# Patient Record
Sex: Female | Born: 1940
Health system: Southern US, Community
[De-identification: ages and names within clinical notes are randomized; demographics above are authoritative.]

## PROBLEM LIST (undated history)

## (undated) DIAGNOSIS — K5900 Constipation, unspecified: Secondary | ICD-10-CM

## (undated) DIAGNOSIS — E669 Obesity, unspecified: Secondary | ICD-10-CM

## (undated) DIAGNOSIS — M25519 Pain in unspecified shoulder: Secondary | ICD-10-CM

## (undated) DIAGNOSIS — I5032 Chronic diastolic (congestive) heart failure: Secondary | ICD-10-CM

## (undated) DIAGNOSIS — R0602 Shortness of breath: Secondary | ICD-10-CM

## (undated) DIAGNOSIS — R609 Edema, unspecified: Secondary | ICD-10-CM

## (undated) DIAGNOSIS — I5189 Other ill-defined heart diseases: Secondary | ICD-10-CM

## (undated) DIAGNOSIS — I10 Essential (primary) hypertension: Secondary | ICD-10-CM

## (undated) DIAGNOSIS — M25569 Pain in unspecified knee: Secondary | ICD-10-CM

## (undated) DIAGNOSIS — K219 Gastro-esophageal reflux disease without esophagitis: Secondary | ICD-10-CM

## (undated) DIAGNOSIS — E559 Vitamin D deficiency, unspecified: Secondary | ICD-10-CM

## (undated) DIAGNOSIS — E785 Hyperlipidemia, unspecified: Secondary | ICD-10-CM

## (undated) DIAGNOSIS — R001 Bradycardia, unspecified: Secondary | ICD-10-CM

## (undated) HISTORY — DX: Obesity, unspecified: E66.9

## (undated) HISTORY — DX: Shortness of breath: R06.02

## (undated) HISTORY — DX: Essential (primary) hypertension: I10

## (undated) HISTORY — DX: Pain in unspecified knee: M25.569

## (undated) HISTORY — DX: Pain in unspecified shoulder: M25.519

## (undated) HISTORY — DX: Edema, unspecified: R60.9

## (undated) HISTORY — DX: Bradycardia, unspecified: R00.1

## (undated) HISTORY — DX: Chronic diastolic (congestive) heart failure: I50.32

## (undated) HISTORY — DX: Other ill-defined heart diseases: I51.89

## (undated) HISTORY — PX: NECK SURGERY: SHX720

## (undated) HISTORY — PX: SHOULDER ARTHROSCOPY: SHX128

## (undated) HISTORY — DX: Vitamin D deficiency, unspecified: E55.9

## (undated) HISTORY — DX: Gastro-esophageal reflux disease without esophagitis: K21.9

## (undated) HISTORY — PX: ABDOMINAL HYSTERECTOMY: SHX81

## (undated) HISTORY — DX: Constipation, unspecified: K59.00

---

## 1970-09-09 HISTORY — PX: OOPHORECTOMY: SHX86

## 1975-09-10 HISTORY — PX: APPENDECTOMY: SHX54

## 1984-09-09 HISTORY — PX: KNEE SURGERY: SHX244

## 1998-04-11 ENCOUNTER — Other Ambulatory Visit: Admission: RE | Admit: 1998-04-11 | Discharge: 1998-04-11 | Payer: Self-pay | Admitting: Obstetrics and Gynecology

## 1999-04-19 ENCOUNTER — Other Ambulatory Visit: Admission: RE | Admit: 1999-04-19 | Discharge: 1999-04-19 | Payer: Self-pay | Admitting: Obstetrics and Gynecology

## 2000-06-09 ENCOUNTER — Other Ambulatory Visit: Admission: RE | Admit: 2000-06-09 | Discharge: 2000-06-09 | Payer: Self-pay | Admitting: Obstetrics and Gynecology

## 2001-08-11 ENCOUNTER — Other Ambulatory Visit: Admission: RE | Admit: 2001-08-11 | Discharge: 2001-08-11 | Payer: Self-pay | Admitting: Obstetrics and Gynecology

## 2002-08-30 ENCOUNTER — Other Ambulatory Visit: Admission: RE | Admit: 2002-08-30 | Discharge: 2002-08-30 | Payer: Self-pay | Admitting: Obstetrics and Gynecology

## 2003-09-27 ENCOUNTER — Encounter: Admission: RE | Admit: 2003-09-27 | Discharge: 2003-09-27 | Payer: Self-pay | Admitting: Gastroenterology

## 2004-10-02 ENCOUNTER — Other Ambulatory Visit: Admission: RE | Admit: 2004-10-02 | Discharge: 2004-10-02 | Payer: Self-pay | Admitting: Obstetrics and Gynecology

## 2008-08-23 ENCOUNTER — Encounter: Admission: RE | Admit: 2008-08-23 | Discharge: 2008-08-23 | Payer: Self-pay | Admitting: Family Medicine

## 2009-02-07 HISTORY — PX: CARDIAC CATHETERIZATION: SHX172

## 2009-02-15 ENCOUNTER — Encounter: Admission: RE | Admit: 2009-02-15 | Discharge: 2009-02-15 | Payer: Self-pay | Admitting: Cardiology

## 2009-02-20 ENCOUNTER — Inpatient Hospital Stay (HOSPITAL_BASED_OUTPATIENT_CLINIC_OR_DEPARTMENT_OTHER): Admission: RE | Admit: 2009-02-20 | Discharge: 2009-02-20 | Payer: Self-pay | Admitting: Cardiology

## 2010-06-12 ENCOUNTER — Encounter: Admission: RE | Admit: 2010-06-12 | Discharge: 2010-06-12 | Payer: Self-pay | Admitting: Neurology

## 2010-12-25 ENCOUNTER — Other Ambulatory Visit (HOSPITAL_BASED_OUTPATIENT_CLINIC_OR_DEPARTMENT_OTHER): Payer: Self-pay | Admitting: Family Medicine

## 2010-12-25 DIAGNOSIS — Z1231 Encounter for screening mammogram for malignant neoplasm of breast: Secondary | ICD-10-CM

## 2010-12-31 ENCOUNTER — Ambulatory Visit (HOSPITAL_BASED_OUTPATIENT_CLINIC_OR_DEPARTMENT_OTHER)
Admission: RE | Admit: 2010-12-31 | Discharge: 2010-12-31 | Disposition: A | Discharge status: Home / Self Care | Payer: Medicare Other | Source: Ambulatory Visit | Attending: Family Medicine | Admitting: Family Medicine

## 2010-12-31 DIAGNOSIS — Z1231 Encounter for screening mammogram for malignant neoplasm of breast: Secondary | ICD-10-CM | POA: Insufficient documentation

## 2011-01-22 NOTE — Cardiovascular Report (Signed)
[name], [name]            ACCOUNT NO.:  [id number]   MEDICAL RECORD NO.:  [id number]          PATIENT TYPE:  OIB   LOCATION:  1965                         FACILITY:  [facility]   PHYSICIAN:  Armanda Magic, M.D.     DATE OF BIRTH:  1941/03/22   DATE OF PROCEDURE:  02/20/2009  DATE OF DISCHARGE:  02/20/2009                            CARDIAC CATHETERIZATION   REFERRING PHYSICIAN:  Molly Maduro L. Foy [place], MD   PROCEDURES:  1. Left heart catheterization.  2. Coronary angiography.  3. Left ventriculography.   OPERATOR:  Armanda Magic, MD   INDICATION:  Chest pain.   COMPLICATIONS:  None.   IV ACCESS:  Via right femoral artery using 4-French sheath.   INTRAVENOUS MEDICATIONS:  Versed 1 mg, fentanyl 25 mcg.   This is a 70 year old female with a history of hypertension, a very  strong family history of coronary disease, who presented with chest pain  and now presents for cardiac catheterization.   The patient was brought to the cardiac catheterization laboratory in a  fasting, nonsedated state.  Informed consent was obtained.  The patient  was connected to continuous heart rate and pulse oximetry monitoring and  intermittent blood pressure monitor.  The right groin was prepped and  draped in a sterile fashion.  A 1% Xylocaine was used for local  anesthesia.  Using the modified Seldinger technique, a 4-French sheath  was placed in the right femoral artery.  Under fluoroscopic guidance, a  4-French JL4 catheter was placed in the left coronary artery.  Multiple  cine films were taken in 30-degree RAO and 40-degree LAO views.  This  catheter was then exchanged out over a guidewire for a 4-French 3DRCA  catheter, which successfully engaged the coronary ostium.  Multiple cine  films were taken at 30-degree RAO and 40-degree LAO views of the right  coronary artery.   The right catheter was then exchanged over a guidewire for a 4-French  angled pigtail catheter, which was placed under  fluoroscopic guidance in  left ventricular cavity.  Left ventriculography was performed in the 30-  degree RAO view using total of 25 mL at 12 mL per second.  The catheter  was then pulled back across the aortic valve with no significant  gradient noted.  At the end of the procedure, all catheters and sheaths  were removed.  Manual compression was performed, and adequate hemostasis  was obtained.  The patient was transferred back to room in stable  condition.   RESULTS:  1. The left main coronary artery is widely patent and bifurcates into      a left anterior descending artery and left circumflex artery.  2. Left anterior descending artery is widely patent throughout its      course in the apex.  It gives rise to a very small first diagonal      branch, which is patent.  It then gives rise to a second moderate-      sized diagonal 2 branch, which is widely patent and bifurcates into      2 daughter branches, both of which are widely patent.  The  vessel      then gives rise to third and fourth diagonal branches, both of      which are widely patent but very small.  The ongoing LAD traverses      the apex and is widely patent.  3. Left circumflex is widely patent throughout its course and      proximally gives rise to a very small obtuse marginal 1 branch,      which is patent.  The ongoing circumflex traverses the AV groove      and distally, gives rise to a large second obtuse marginal branch,      which bifurcates into 2 daughter vessels.  The superior branch then      bifurcates again into 2 daughter vessels, both of which are widely      patent.  The inferior branch is widely patent as well.  The ongoing      circumflex is widely patent.  4. The right coronary artery is widely patent throughout its course      and distally bifurcates in a posterior descending artery and      posterolateral artery.  The PDA and PL are both widely patent and      traversing inferior wall.   Left  ventriculography shows normal LV function, EF 65%, LV pressure  139/13 mmHg, aortic pressure 136/57 mmHg, LVEDP 22 mmHg.   ASSESSMENT:  1. Normal coronary arteries.  2. Noncardiac chest pain.  3. Normal left ventricular function.  4. Elevated left ventricular end-diastolic pressure, consistent with      diastolic dysfunction.  5. Hypertension.   PLAN:  Discharge to home after IV fluid and bedrest.  Continue beta-  blocker for diastolic dysfunction.  We will add Lasix 20 mg a day for  elevated LVEDP.  Follow up in 2 weeks with me for groin check, and we  will check her BMET at that time.      Armanda Magic, M.D.  Electronically Signed     TT/MEDQ  D:  02/20/2009  T:  02/21/2009  Job:  [id number]   cc:   [name], M.D.

## 2011-04-22 ENCOUNTER — Emergency Department (INDEPENDENT_AMBULATORY_CARE_PROVIDER_SITE_OTHER): Payer: Medicare Other

## 2011-04-22 ENCOUNTER — Emergency Department (HOSPITAL_BASED_OUTPATIENT_CLINIC_OR_DEPARTMENT_OTHER)
Admission: EM | Admit: 2011-04-22 | Discharge: 2011-04-22 | Disposition: A | Discharge status: Home / Self Care | Payer: Medicare Other | Attending: Emergency Medicine | Admitting: Emergency Medicine

## 2011-04-22 DIAGNOSIS — S50311A Abrasion of right elbow, initial encounter: Secondary | ICD-10-CM

## 2011-04-22 DIAGNOSIS — Y93H2 Activity, gardening and landscaping: Secondary | ICD-10-CM | POA: Insufficient documentation

## 2011-04-22 DIAGNOSIS — E785 Hyperlipidemia, unspecified: Secondary | ICD-10-CM | POA: Insufficient documentation

## 2011-04-22 DIAGNOSIS — R079 Chest pain, unspecified: Secondary | ICD-10-CM

## 2011-04-22 DIAGNOSIS — K219 Gastro-esophageal reflux disease without esophagitis: Secondary | ICD-10-CM | POA: Insufficient documentation

## 2011-04-22 DIAGNOSIS — I10 Essential (primary) hypertension: Secondary | ICD-10-CM | POA: Insufficient documentation

## 2011-04-22 DIAGNOSIS — M25529 Pain in unspecified elbow: Secondary | ICD-10-CM

## 2011-04-22 DIAGNOSIS — S20219A Contusion of unspecified front wall of thorax, initial encounter: Secondary | ICD-10-CM | POA: Insufficient documentation

## 2011-04-22 DIAGNOSIS — Y92009 Unspecified place in unspecified non-institutional (private) residence as the place of occurrence of the external cause: Secondary | ICD-10-CM | POA: Insufficient documentation

## 2011-04-22 DIAGNOSIS — M79609 Pain in unspecified limb: Secondary | ICD-10-CM

## 2011-04-22 DIAGNOSIS — I517 Cardiomegaly: Secondary | ICD-10-CM | POA: Insufficient documentation

## 2011-04-22 DIAGNOSIS — W11XXXA Fall on and from ladder, initial encounter: Secondary | ICD-10-CM

## 2011-04-22 DIAGNOSIS — T148XXA Other injury of unspecified body region, initial encounter: Secondary | ICD-10-CM

## 2011-04-22 DIAGNOSIS — K449 Diaphragmatic hernia without obstruction or gangrene: Secondary | ICD-10-CM | POA: Insufficient documentation

## 2011-04-22 DIAGNOSIS — IMO0002 Reserved for concepts with insufficient information to code with codable children: Secondary | ICD-10-CM | POA: Insufficient documentation

## 2011-04-22 HISTORY — DX: Gastro-esophageal reflux disease without esophagitis: K21.9

## 2011-04-22 HISTORY — DX: Hyperlipidemia, unspecified: E78.5

## 2011-04-22 HISTORY — DX: Essential (primary) hypertension: I10

## 2011-04-22 LAB — URINALYSIS, ROUTINE W REFLEX MICROSCOPIC
Bilirubin Urine: NEGATIVE
Glucose, UA: NEGATIVE mg/dL
Ketones, ur: NEGATIVE mg/dL
Leukocytes, UA: NEGATIVE
Nitrite: NEGATIVE
Protein, ur: NEGATIVE mg/dL
Specific Gravity, Urine: 1.006 (ref 1.005–1.030)
Urobilinogen, UA: 0.2 mg/dL (ref 0.0–1.0)
pH: 7 (ref 5.0–8.0)

## 2011-04-22 LAB — DIFFERENTIAL
Basophils Relative: 0 % (ref 0–1)
Eosinophils Absolute: 0.1 10*3/uL (ref 0.0–0.7)
Lymphocytes Relative: 23 % (ref 12–46)
Lymphs Abs: 2.3 10*3/uL (ref 0.7–4.0)
Monocytes Absolute: 0.5 10*3/uL (ref 0.1–1.0)
Monocytes Relative: 5 % (ref 3–12)
Neutrophils Relative %: 70 % (ref 43–77)

## 2011-04-22 LAB — CBC
HCT: 37.6 % (ref 36.0–46.0)
Hemoglobin: 13 g/dL (ref 12.0–15.0)
MCH: 31.9 pg (ref 26.0–34.0)
Platelets: 206 10*3/uL (ref 150–400)
RBC: 4.08 MIL/uL (ref 3.87–5.11)
RDW: 12.5 % (ref 11.5–15.5)
WBC: 10 10*3/uL (ref 4.0–10.5)

## 2011-04-22 LAB — COMPREHENSIVE METABOLIC PANEL
ALT: 14 U/L (ref 0–35)
AST: 20 U/L (ref 0–37)
Albumin: 4.1 g/dL (ref 3.5–5.2)
Alkaline Phosphatase: 69 U/L (ref 39–117)
BUN: 21 mg/dL (ref 6–23)
CO2: 24 mEq/L (ref 19–32)
Chloride: 104 mEq/L (ref 96–112)
Creatinine, Ser: 0.5 mg/dL (ref 0.50–1.10)
GFR calc Af Amer: >60 mL/min (ref >60)
Glucose, Bld: 92 mg/dL (ref 70–99)
Potassium: 3.8 mEq/L (ref 3.5–5.1)
Sodium: 141 mEq/L (ref 135–145)
Total Bilirubin: 0.3 mg/dL (ref 0.3–1.2)
Total Protein: 7.2 g/dL (ref 6.0–8.3)

## 2011-04-22 MED ORDER — HYDROMORPHONE HCL 1 MG/ML IJ SOLN: 1.0000 mg | Freq: Once | INTRAMUSCULAR | Status: DC

## 2011-04-22 MED ORDER — OXYCODONE-ACETAMINOPHEN 5-325 MG PO TABS: 1.0000 | ORAL_TABLET | ORAL | Status: AC | PRN

## 2011-04-22 MED ORDER — ONDANSETRON 8 MG PO TBDP
8.0000 mg | ORAL_TABLET | Freq: Once | ORAL | Status: AC
  Administered 2011-04-22: 8 mg via ORAL
  Filled 2011-04-22: qty 1

## 2011-04-22 MED ORDER — ONDANSETRON HCL 4 MG/2ML IJ SOLN: 4.0000 mg | Freq: Once | INTRAMUSCULAR | Status: DC

## 2011-04-22 MED ORDER — IOHEXOL 300 MG/ML  SOLN
100.0000 mL | Freq: Once | INTRAMUSCULAR | Status: AC | PRN
  Administered 2011-04-22: 100 mL via INTRAVENOUS

## 2011-04-22 MED ORDER — HYDROMORPHONE HCL 2 MG/ML IJ SOLN
2.0000 mg | Freq: Once | INTRAMUSCULAR | Status: AC
  Administered 2011-04-22: 2 mg via INTRAMUSCULAR
  Filled 2011-04-22: qty 1

## 2011-04-22 MED ORDER — METOCLOPRAMIDE HCL 5 MG/ML IJ SOLN
INTRAMUSCULAR | Status: AC
  Administered 2011-04-22: 10 mg via INTRAVENOUS
  Filled 2011-04-22: qty 2

## 2011-04-22 MED ORDER — METOCLOPRAMIDE HCL 10 MG PO TABS: 10.0000 mg | ORAL_TABLET | Freq: Four times a day (QID) | ORAL | Status: AC

## 2011-04-22 MED ORDER — SODIUM CHLORIDE 0.9 % IV SOLN: Freq: Once | INTRAVENOUS | Status: DC

## 2011-04-22 MED ORDER — METOCLOPRAMIDE HCL 5 MG/ML IJ SOLN
10.0000 mg | Freq: Once | INTRAMUSCULAR | Status: AC
  Administered 2011-04-22: 10 mg via INTRAVENOUS

## 2011-04-22 NOTE — ED Provider Notes (Signed)
History     CSN: [id number] Arrival date & time: 04/22/2011 12:12 PM  Chief Complaint  Patient presents with  . Fall    pain right ribs; right elbow and right upper and lower leg   Patient is a 70 y.o. female presenting with fall. The history is provided by the patient.  Fall The accident occurred 1 to 2 hours ago. The fall occurred from a ladder. She fell from a height of 3 to 5 ft. Impact surface: She was trimming a bush and fell off the ladder, her right elbow hitting a brick wall, then landing on her right side. There was no blood loss.  She also suffered some minor bruises to her right leg. Pain is severe, 10/10. She denies head, neck or back injury.  Past Medical History  Diagnosis Date  . Hyperlipemia   . Hypertension   . Gastric reflux     Past Surgical History  Procedure Date  . Shoulder arthroscopy   . Neck surgery   . Oophorectomy   . Abdominal hysterectomy     No family history on file.  History  Substance Use Topics  . Smoking status: Never Smoker   . Smokeless tobacco: Not on file  . Alcohol Use: No    OB History    Grav Para Term Preterm Abortions TAB SAB Ect Mult Living                  Review of Systems  All other systems reviewed and are negative.    Physical Exam  BP 134/63  Pulse 58  Temp(Src) 98.6 F (37 C) (Oral)  Resp 20  SpO2 97%  Physical Exam  Nursing note and vitals reviewed. Constitutional: She is oriented to person, place, and time. She appears well-developed and well-nourished.       Appears to be in pain.  HENT:  Head: Normocephalic and atraumatic.  Right Ear: External ear normal.  Left Ear: External ear normal.  Nose: Nose normal.  Mouth/Throat: Oropharynx is clear and moist.  Eyes: Conjunctivae and EOM are normal. Pupils are equal, round, and reactive to light. No scleral icterus.  Neck: No JVD present.       No cervical tenderness.  Cardiovascular: Normal rate, regular rhythm, normal heart sounds and intact distal  pulses.   No murmur heard. Pulmonary/Chest: Effort normal and breath sounds normal. She has no wheezes. She has no rales.       Point tenderness over the lower right lateral rib cage.  Abdominal: Soft. Bowel sounds are normal. There is no tenderness.  Musculoskeletal: Normal range of motion.       Minor abrasion, moderate swelling of the right olecranon. Full passive ROM, neurovascular is intact.  Lymphadenopathy:    She has no cervical adenopathy.  Neurological: She is alert and oriented to person, place, and time. No cranial nerve deficit. Coordination normal.  Skin: Skin is warm and dry. No rash noted.  Psychiatric: She has a normal mood and affect.    ED Course  Procedures  MDM Blunt trauma, rule out fracture, internal injury.  Labs and x-rays reviewed, images viewed by me. No internal or bony injury. She got good relief of pain from Dilaudid and Zofran.  Results for orders placed during the hospital encounter of 04/22/11  CBC      Component Value Range   WBC 10.0  4.0 - 10.5 (K/uL)   RBC 4.08  3.87 - 5.11 (MIL/uL)   Hemoglobin 13.0  12.0 -  15.0 (g/dL)   HCT 45.4  09.8 - 11.9 (%)   MCV 92.2  78.0 - 100.0 (fL)   MCH 31.9  26.0 - 34.0 (pg)   MCHC 34.6  30.0 - 36.0 (g/dL)   RDW 14.7  82.9 - 56.2 (%)   Platelets 206  150 - 400 (K/uL)  DIFFERENTIAL      Component Value Range   Neutrophils Relative 70  43 - 77 (%)   Neutro Abs 7.0  1.7 - 7.7 (K/uL)   Lymphocytes Relative 23  12 - 46 (%)   Lymphs Abs 2.3  0.7 - 4.0 (K/uL)   Monocytes Relative 5  3 - 12 (%)   Monocytes Absolute 0.5  0.1 - 1.0 (K/uL)   Eosinophils Relative 1  0 - 5 (%)   Eosinophils Absolute 0.1  0.0 - 0.7 (K/uL)   Basophils Relative 0  0 - 1 (%)   Basophils Absolute 0.0  0.0 - 0.1 (K/uL)  COMPREHENSIVE METABOLIC PANEL      Component Value Range   Sodium 141  135 - 145 (mEq/L)   Potassium 3.8  3.5 - 5.1 (mEq/L)   Chloride 104  96 - 112 (mEq/L)   CO2 24  19 - 32 (mEq/L)   Glucose, Bld 92  70 - 99 (mg/dL)     BUN 21  6 - 23 (mg/dL)   Creatinine, Ser 1.30  0.50 - 1.10 (mg/dL)   Calcium 9.4  8.4 - 86.5 (mg/dL)   Total Protein 7.2  6.0 - 8.3 (g/dL)   Albumin 4.1  3.5 - 5.2 (g/dL)   AST 20  0 - 37 (U/L)   ALT 14  0 - 35 (U/L)   Alkaline Phosphatase 69  39 - 117 (U/L)   Total Bilirubin 0.3  0.3 - 1.2 (mg/dL)   GFR calc non Af Amer >60  >60 (mL/min)   GFR calc Af Amer >60  >60 (mL/min)  URINALYSIS, ROUTINE W REFLEX MICROSCOPIC      Component Value Range   Color, Urine YELLOW  YELLOW    Appearance CLEAR  CLEAR    Specific Gravity, Urine 1.006  1.005 - 1.030    pH 7.0  5.0 - 8.0    Glucose, UA NEGATIVE  NEGATIVE (mg/dL)   Hgb urine dipstick NEGATIVE  NEGATIVE    Bilirubin Urine NEGATIVE  NEGATIVE    Ketones, ur NEGATIVE  NEGATIVE (mg/dL)   Protein, ur NEGATIVE  NEGATIVE (mg/dL)   Urobilinogen, UA 0.2  0.0 - 1.0 (mg/dL)   Nitrite NEGATIVE  NEGATIVE    Leukocytes, UA NEGATIVE  NEGATIVE    Dg Elbow Complete Right  04/22/2011  *RADIOLOGY REPORT*  Clinical Data: Larey Seat from a ladder.  Landed on brick wall on right side.  Pain in the lateral ribs and right elbow.  Laceration.  RIGHT ELBOW - COMPLETE 3+ VIEW  Comparison: None.  Findings: There is no evidence for acute fracture or subluxation. No radiopaque foreign body or soft tissue gas identified. Degenerative changes are seen in the elbow.  IMPRESSION: Negative exam.  Original Report Authenticated By: Patterson Hammersmith, M.D.   Ct Chest W Contrast  04/22/2011  *RADIOLOGY REPORT*  Clinical Data:  Trauma, fall from ladder, hit brick wall  CT CHEST, ABDOMEN AND PELVIS WITH CONTRAST  Technique:  Multidetector CT imaging of the chest, abdomen and pelvis was performed following the standard protocol during bolus administration of intravenous contrast.  Contrast: 100 ml Omnipaque-300 IV  Comparison:  None.  CT  CHEST  Findings:  Visualized thyroid is unremarkable.  Mild patchy opacity in the bilateral lower lobes, likely atelectasis.  No suspicious pulmonary  nodules. No pleural effusion or pneumothorax.  Mild cardiomegaly.  No pericardial effusion.  Mild atherosclerotic calcifications of the aortic arch.  No suspicious mediastinal, hilar, or axillary lymphadenopathy.  Degenerative changes of the thoracic spine.  Prior anterior cervical spine fixation incompletely visualized.  No fracture is seen.  IMPRESSION: No evidence of traumatic injury to the chest.  CT ABDOMEN AND PELVIS  Findings:  Small hiatal hernia.  Numerous hypoenhancing lesions throughout the liver, some which are too small to characterize, but many of which reflect benign cysts or hemangiomas.  Spleen, pancreas, and adrenal glands are within normal limits.  Gallbladder is unremarkable.  No intrahepatic or extrahepatic ductal dilatation.  Kidneys are within normal limits.  No hydronephrosis.  No evidence of bowel obstruction.  No abdominopelvic ascites.  No hemoperitoneum.  No free air.  No suspicious abdominopelvic lymphadenopathy.  Atherosclerotic calcifications of the abdominal aorta and branch vessels.  Status post hysterectomy.  No adnexal masses.  Bladder is within normal limits.  Degenerative changes of the lumbar spine.  No fracture is seen.  IMPRESSION: No evidence of traumatic injury to the abdomen/pelvis.  Original Report Authenticated By: Charline Bills, M.D.   Ct Abdomen Pelvis W Contrast  04/22/2011  *RADIOLOGY REPORT*  Clinical Data:  Trauma, fall from ladder, hit brick wall  CT CHEST, ABDOMEN AND PELVIS WITH CONTRAST  Technique:  Multidetector CT imaging of the chest, abdomen and pelvis was performed following the standard protocol during bolus administration of intravenous contrast.  Contrast: 100 ml Omnipaque-300 IV  Comparison:  None.  CT CHEST  Findings:  Visualized thyroid is unremarkable.  Mild patchy opacity in the bilateral lower lobes, likely atelectasis.  No suspicious pulmonary nodules. No pleural effusion or pneumothorax.  Mild cardiomegaly.  No pericardial effusion.  Mild  atherosclerotic calcifications of the aortic arch.  No suspicious mediastinal, hilar, or axillary lymphadenopathy.  Degenerative changes of the thoracic spine.  Prior anterior cervical spine fixation incompletely visualized.  No fracture is seen.  IMPRESSION: No evidence of traumatic injury to the chest.  CT ABDOMEN AND PELVIS  Findings:  Small hiatal hernia.  Numerous hypoenhancing lesions throughout the liver, some which are too small to characterize, but many of which reflect benign cysts or hemangiomas.  Spleen, pancreas, and adrenal glands are within normal limits.  Gallbladder is unremarkable.  No intrahepatic or extrahepatic ductal dilatation.  Kidneys are within normal limits.  No hydronephrosis.  No evidence of bowel obstruction.  No abdominopelvic ascites.  No hemoperitoneum.  No free air.  No suspicious abdominopelvic lymphadenopathy.  Atherosclerotic calcifications of the abdominal aorta and branch vessels.  Status post hysterectomy.  No adnexal masses.  Bladder is within normal limits.  Degenerative changes of the lumbar spine.  No fracture is seen.  IMPRESSION: No evidence of traumatic injury to the abdomen/pelvis.  Original Report Authenticated By: Charline Bills, M.D.          Dione Booze, MD 04/22/11 [phone]

## 2011-04-22 NOTE — ED Notes (Signed)
MD at bedside. 

## 2011-04-22 NOTE — ED Notes (Signed)
Fell off of ladder approx 3 feet with an electric cutter in her hand and ladder flipped backwards and hit brick wall on right side; did not hit head; no LOC

## 2011-04-22 NOTE — ED Notes (Signed)
Pt returned from CT. Pt is CAO and in NAD. Pt sts the Reglan has helped her nausea and sts that her pain is "ok". Pt and family updated on POC and verbalized understanding.

## 2011-04-22 NOTE — ED Notes (Signed)
Pt lying in bed sleeping but rouses to voice. Pt is then CAO and in NAD. Pt and family awaiting re-eval by ED MD. No needs voiced at this time.

## 2012-01-14 ENCOUNTER — Other Ambulatory Visit (HOSPITAL_BASED_OUTPATIENT_CLINIC_OR_DEPARTMENT_OTHER): Payer: Self-pay | Admitting: Family

## 2012-01-14 DIAGNOSIS — Z1231 Encounter for screening mammogram for malignant neoplasm of breast: Secondary | ICD-10-CM

## 2012-01-15 ENCOUNTER — Ambulatory Visit (HOSPITAL_BASED_OUTPATIENT_CLINIC_OR_DEPARTMENT_OTHER)
Admission: RE | Admit: 2012-01-15 | Discharge: 2012-01-15 | Disposition: A | Discharge status: Home / Self Care | Payer: Medicare Other | Source: Ambulatory Visit | Attending: Family Medicine | Admitting: Family Medicine

## 2012-01-15 DIAGNOSIS — Z1231 Encounter for screening mammogram for malignant neoplasm of breast: Secondary | ICD-10-CM | POA: Insufficient documentation

## 2013-03-08 ENCOUNTER — Other Ambulatory Visit (HOSPITAL_BASED_OUTPATIENT_CLINIC_OR_DEPARTMENT_OTHER): Payer: Self-pay | Admitting: Family Medicine

## 2013-03-08 DIAGNOSIS — Z1231 Encounter for screening mammogram for malignant neoplasm of breast: Secondary | ICD-10-CM

## 2013-03-10 ENCOUNTER — Ambulatory Visit (HOSPITAL_BASED_OUTPATIENT_CLINIC_OR_DEPARTMENT_OTHER)
Admission: RE | Admit: 2013-03-10 | Discharge: 2013-03-10 | Disposition: A | Discharge status: Home / Self Care | Payer: Medicare Other | Source: Ambulatory Visit | Attending: Family Medicine | Admitting: Family Medicine

## 2013-03-10 DIAGNOSIS — Z1231 Encounter for screening mammogram for malignant neoplasm of breast: Secondary | ICD-10-CM | POA: Insufficient documentation

## 2014-01-05 ENCOUNTER — Encounter: Payer: Self-pay | Admitting: General Surgery

## 2014-01-05 DIAGNOSIS — I10 Essential (primary) hypertension: Secondary | ICD-10-CM | POA: Insufficient documentation

## 2014-01-05 HISTORY — DX: Essential (primary) hypertension: I10

## 2014-01-12 ENCOUNTER — Telehealth: Payer: Self-pay | Admitting: Cardiology

## 2014-01-12 NOTE — Telephone Encounter (Signed)
Patient has an appointment on 01/18/14, wants to know if lab work should be done prior to that appointment? Please call and advise.

## 2014-01-12 NOTE — Telephone Encounter (Signed)
Pt is aware to fast. We might do fasting labs that day.

## 2014-01-18 ENCOUNTER — Encounter (INDEPENDENT_AMBULATORY_CARE_PROVIDER_SITE_OTHER): Payer: Self-pay

## 2014-01-18 ENCOUNTER — Encounter: Payer: Self-pay | Admitting: Cardiology

## 2014-01-18 ENCOUNTER — Ambulatory Visit (INDEPENDENT_AMBULATORY_CARE_PROVIDER_SITE_OTHER): Payer: Medicare Other | Admitting: Cardiology

## 2014-01-18 VITALS — BP 133/69 | HR 56 | Ht 62.5 in | Wt <= 1120 oz

## 2014-01-18 DIAGNOSIS — I10 Essential (primary) hypertension: Secondary | ICD-10-CM

## 2014-01-18 DIAGNOSIS — I5032 Chronic diastolic (congestive) heart failure: Secondary | ICD-10-CM

## 2014-01-18 DIAGNOSIS — E785 Hyperlipidemia, unspecified: Secondary | ICD-10-CM

## 2014-01-18 DIAGNOSIS — I509 Heart failure, unspecified: Secondary | ICD-10-CM

## 2014-01-18 DIAGNOSIS — I5189 Other ill-defined heart diseases: Secondary | ICD-10-CM | POA: Insufficient documentation

## 2014-01-18 LAB — LIPID PANEL
Cholesterol: 137 mg/dL (ref 0–200)
HDL: 36.4 mg/dL — ABNORMAL LOW (ref >39.00)
LDL CALC: 72 mg/dL (ref 0–99)
Total CHOL/HDL Ratio: 4
Triglycerides: 142 mg/dL (ref 0.0–149.0)
VLDL: 28.4 mg/dL (ref 0.0–40.0)

## 2014-01-18 LAB — BASIC METABOLIC PANEL
BUN: 27 mg/dL — ABNORMAL HIGH (ref 6–23)
CALCIUM: 8.8 mg/dL (ref 8.4–10.5)
CO2: 26 mEq/L (ref 19–32)
Chloride: 105 mEq/L (ref 96–112)
Creatinine, Ser: 0.6 mg/dL (ref 0.4–1.2)
GFR: 101.01 mL/min (ref >60.00)
Glucose, Bld: 100 mg/dL — ABNORMAL HIGH (ref 70–99)
Potassium: 3.8 mEq/L (ref 3.5–5.1)
SODIUM: 138 meq/L (ref 135–145)

## 2014-01-18 LAB — ALT: ALT: 37 U/L — AB (ref 0–35)

## 2014-01-18 NOTE — Progress Notes (Signed)
[address] [place], [place]  [id number] Phone: [phone] Fax:  [phone]  Date:  01/18/2014   ID:  [name], DOB November 12, 1940, MRN [id number]  PCP:  Lenora BoysFRIED, ROBERT L, MD  Cardiologist:  Armanda Magicraci Marie Chow, MD     History of Present Illness: [name] is a 73 y.o. female with a history of chronic diastolic CHF, normal LVF, normal coronary arteries by cath 2010, HTN and dyslipidemia who presents today for followup.  She is doing well.  She denies any dizziness, palpitations or syncope. She has gained some weight.  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  She has been having a sharp pain in her upper left scapula and left shoulder but no chest pain.  It mainly occurs when she gets in bed and rolls over.  She does a lot of lifting of heavy furniture with her cleaning.  She does not get any pain in her back with exertion it is always afterwards when she rests.  She has cervical spine disease and has had surgery and is going to see a Landchiropractor.  She occasionally has some LE edema if on her feet a long time.  She has chronic DOE when climbing steps which is stable.    Wt Readings from Last 3 Encounters:  01/18/14 7 lb 12.8 oz (3.538 kg)     Past Medical History  Diagnosis Date  . Gastric reflux   . Obesity   . GERD (gastroesophageal reflux disease)   . Hypertension   . Hyperlipemia     dyslipidemia  . Diastolic dysfunction   . Chronic diastolic CHF (congestive heart failure)     Current Outpatient Prescriptions  Medication Sig Dispense Refill  . aspirin 81 MG tablet Take 81 mg by mouth daily.      Marland Kitchen. ESTRADIOL ACETATE PO Take 0.5 tablets by mouth daily.       . furosemide (LASIX) 20 MG tablet Take 20 mg by mouth daily.       Marland Kitchen. lisinopril (PRINIVIL,ZESTRIL) 10 MG tablet Take 10 mg by mouth daily.      Marland Kitchen. lovastatin (MEVACOR) 10 MG tablet Take 40 mg by mouth at bedtime.       . meloxicam (MOBIC) 7.5 MG tablet Take 7.5 mg by mouth daily.       . metoprolol succinate (TOPROL-XL) 25 MG 24 hr tablet Take 25 mg by mouth daily.      . Omega-3 Fatty Acids (FISH OIL) 1000 MG CAPS Take by mouth daily.      . Omeprazole (PRILOSEC PO) Take 20 mg by mouth daily.       . Probiotic Product (PROBIOTIC DAILY PO) Take by mouth daily.      Marland Kitchen. zolpidem (AMBIEN) 10 MG tablet Take 5 mg by mouth at bedtime as needed for sleep.        No current facility-administered medications for this visit.    Allergies:    Allergies  Allergen Reactions  . Morphine And Related Nausea And Vomiting    Social History:  The patient  reports that she has never smoked. She does not have any smokeless tobacco history on file. She reports that she does not drink alcohol or use illicit drugs.   Family History:  The patient's family history includes CAD in her brother, brother, brother, father, and sister; Pancreatic cancer in her brother.   ROS:  Please see the history of present illness.  All other systems reviewed and negative.   PHYSICAL EXAM: VS:  BP 133/69  Pulse 56  Ht 5' 2.5" (1.588 m)  Wt 7 lb 12.8 oz (3.538 kg)  BMI 1.40 kg/m2 Well nourished, well developed, in no acute distress HEENT: normal Neck: no JVD Cardiac:  normal S1, S2; RRR; no murmur Lungs:  clear to auscultation bilaterally, no wheezing, rhonchi or rales Abd: soft, nontender, no hepatomegaly Ext: no edema Skin: warm and dry Neuro:  CNs 2-12 intact, no focal abnormalities noted  EKG:     Sinus bradycardia with nonspecific T wave abnormality with septal infarct - no change from EKG 01/11/2013  ASSESSMENT AND PLAN:  1. Chronic diastolic CHF well compensated - check Lisinopril/lasix/Metoprolol - check BMET 2. HTN well controlled - continue Metoprolol/Lisinopril 3. Dyslipidemia - check fasting lipids and ALT - continue Lovastatin/fish oil 4. Normal coronary arteries by cath 2010  Followup with me in 1 year  Signed, Armanda Magicraci Syanna Remmert, MD 01/18/2014 9:18 AM

## 2014-01-18 NOTE — Patient Instructions (Addendum)
Your physician recommends that you continue on your current medications as directed. Please refer to the Current Medication list given to you today.  Your physician recommends that you go to the lab today  for a FASTING Lipid, ALT, and BMET  Your physician wants you to follow-up in: 1 year with Dr Sherlyn Lickurner You will receive a reminder letter in the mail two months in advance. If you don't receive a letter, please call our office to schedule the follow-up appointment.

## 2014-01-20 ENCOUNTER — Other Ambulatory Visit: Payer: Self-pay | Admitting: General Surgery

## 2014-01-20 ENCOUNTER — Encounter: Payer: Self-pay | Admitting: General Surgery

## 2014-01-20 DIAGNOSIS — E785 Hyperlipidemia, unspecified: Secondary | ICD-10-CM

## 2014-01-20 MED ORDER — LOVASTATIN 40 MG PO TABS: 40.0000 mg | ORAL_TABLET | Freq: Every day | ORAL | Status: DC

## 2014-01-24 ENCOUNTER — Telehealth: Payer: Self-pay | Admitting: Cardiology

## 2014-01-24 NOTE — Telephone Encounter (Signed)
LVm for pt to return call.  

## 2014-01-24 NOTE — Telephone Encounter (Signed)
New message  ° ° °Patient calling for test results.   °

## 2014-01-24 NOTE — Telephone Encounter (Signed)
Follow up  ° ° ° °Returning call back to nurse  °

## 2014-01-24 NOTE — Telephone Encounter (Signed)
LVM for pt to return call

## 2014-01-25 MED ORDER — LISINOPRIL 10 MG PO TABS: 10.0000 mg | ORAL_TABLET | Freq: Every day | ORAL | Status: DC

## 2014-01-25 MED ORDER — LOVASTATIN 40 MG PO TABS: 40.0000 mg | ORAL_TABLET | Freq: Every day | ORAL | Status: DC

## 2014-01-25 MED ORDER — FUROSEMIDE 20 MG PO TABS: 20.0000 mg | ORAL_TABLET | Freq: Every day | ORAL | Status: DC

## 2014-01-25 MED ORDER — METOPROLOL SUCCINATE ER 25 MG PO TB24: 25.0000 mg | ORAL_TABLET | Freq: Every day | ORAL | Status: DC

## 2014-01-25 NOTE — Telephone Encounter (Signed)
Made pt aware of results. Told her we can not email.

## 2014-01-25 NOTE — Telephone Encounter (Signed)
Sent in pts

## 2014-01-25 NOTE — Telephone Encounter (Signed)
Follow up      Want test results.  You can email them at [email or web address].

## 2014-02-21 ENCOUNTER — Telehealth: Payer: Self-pay | Admitting: *Deleted

## 2014-02-21 ENCOUNTER — Other Ambulatory Visit: Payer: Self-pay | Admitting: *Deleted

## 2014-02-21 MED ORDER — LOVASTATIN 40 MG PO TABS: 40.0000 mg | ORAL_TABLET | Freq: Every day | ORAL | Status: DC

## 2014-02-21 NOTE — Telephone Encounter (Signed)
LVM for pt to return call and let me know what medication she needs refilled and to what pharmacy.

## 2014-02-21 NOTE — Telephone Encounter (Signed)
Patient called for refill

## 2014-02-22 NOTE — Telephone Encounter (Signed)
[facility]

## 2014-04-07 ENCOUNTER — Other Ambulatory Visit (HOSPITAL_BASED_OUTPATIENT_CLINIC_OR_DEPARTMENT_OTHER): Payer: Self-pay | Admitting: Family Medicine

## 2014-04-07 DIAGNOSIS — Z1231 Encounter for screening mammogram for malignant neoplasm of breast: Secondary | ICD-10-CM

## 2014-04-11 ENCOUNTER — Ambulatory Visit (HOSPITAL_BASED_OUTPATIENT_CLINIC_OR_DEPARTMENT_OTHER)
Admission: RE | Admit: 2014-04-11 | Discharge: 2014-04-11 | Disposition: A | Discharge status: Home / Self Care | Payer: Medicare Other | Source: Ambulatory Visit | Attending: Family Medicine | Admitting: Family Medicine

## 2014-04-11 DIAGNOSIS — Z1231 Encounter for screening mammogram for malignant neoplasm of breast: Secondary | ICD-10-CM | POA: Diagnosis not present

## 2014-05-10 ENCOUNTER — Telehealth: Payer: Self-pay | Admitting: *Deleted

## 2014-05-10 NOTE — Telephone Encounter (Signed)
[facility] requests meloxicam and omeprazole refill for parient. Does Dr Mayford Knife refill these? Please advise. Thanks, MI

## 2014-05-10 NOTE — Telephone Encounter (Signed)
No PCP refills both.

## 2014-05-13 ENCOUNTER — Other Ambulatory Visit: Payer: Self-pay

## 2014-05-13 MED ORDER — OMEPRAZOLE 20 MG PO CPDR: 20.0000 mg | DELAYED_RELEASE_CAPSULE | Freq: Every day | ORAL | Status: DC

## 2014-06-08 ENCOUNTER — Other Ambulatory Visit: Payer: Self-pay | Admitting: Family Medicine

## 2014-06-08 DIAGNOSIS — E2839 Other primary ovarian failure: Secondary | ICD-10-CM

## 2014-06-08 DIAGNOSIS — N951 Menopausal and female climacteric states: Secondary | ICD-10-CM

## 2014-06-08 DIAGNOSIS — Z780 Asymptomatic menopausal state: Secondary | ICD-10-CM

## 2014-06-14 ENCOUNTER — Ambulatory Visit
Admission: RE | Admit: 2014-06-14 | Discharge: 2014-06-14 | Disposition: A | Discharge status: Home / Self Care | Payer: Medicare Other | Source: Ambulatory Visit | Attending: Family Medicine | Admitting: Family Medicine

## 2014-06-14 DIAGNOSIS — Z780 Asymptomatic menopausal state: Secondary | ICD-10-CM

## 2014-06-14 DIAGNOSIS — E2839 Other primary ovarian failure: Secondary | ICD-10-CM

## 2014-07-21 ENCOUNTER — Other Ambulatory Visit: Payer: Medicare Other

## 2014-08-23 ENCOUNTER — Ambulatory Visit (INDEPENDENT_AMBULATORY_CARE_PROVIDER_SITE_OTHER): Payer: Medicare Other | Admitting: Podiatry

## 2014-08-23 ENCOUNTER — Encounter: Payer: Self-pay | Admitting: Podiatry

## 2014-08-23 ENCOUNTER — Ambulatory Visit (INDEPENDENT_AMBULATORY_CARE_PROVIDER_SITE_OTHER): Payer: Medicare Other

## 2014-08-23 VITALS — BP 132/77 | HR 60 | Resp 16 | Ht 62.5 in | Wt 203.0 lb

## 2014-08-23 DIAGNOSIS — M722 Plantar fascial fibromatosis: Secondary | ICD-10-CM

## 2014-08-23 MED ORDER — MELOXICAM 15 MG PO TABS: 15.0000 mg | ORAL_TABLET | Freq: Every day | ORAL | Status: DC

## 2014-08-23 MED ORDER — METHYLPREDNISOLONE (PAK) 4 MG PO TABS: ORAL_TABLET | ORAL | Status: DC

## 2014-08-23 NOTE — Progress Notes (Signed)
   Subjective:    Patient ID: [name], female    DOB: [birth year], 73 y.o.   MRN: [id number]  HPI Comments: Left back and plantar heel pain, very painful in the morning. Friday after thanksgiving it hurt. Was on feet a lot , went to doctor and she would not give me an injection .   Foot Pain      Review of Systems  All other systems reviewed and are negative.      Objective:   Physical Exam: I have reviewed her past medical history medications allergies surgery social history and review of systems area pulses are strongly palpable bilateral. Since was intact as was the monofilament. Deep tendon reflexes are intact bilateral muscle strength is 5 over 5 dorsiflexion plantar flexors and inverters and everters all edges of musculature is intact. Orthopedic evaluation dentistry is all joints distal to the ankle held full range of motion without crepitation. There she does have pain on palpation of the posterior aspect of the calcaneus left and the plantar fascial calcaneal insertion site left.        Assessment & Plan:  Assessment: Plantar fasciitis with compensatory Achilles tendinitis left.  Plan: Injected the left heel today with Kenalog and local anesthetic. Started her on a Medrol Dosepak to be followed by meloxicam. Dispensed a plantar fascial brace and a night splint. Discussed appropriate shoe gear stretching exercises ice therapy and shoe gear modifications. I will follow-up with her in 1 month

## 2014-08-23 NOTE — Patient Instructions (Signed)
Achilles Tendinitis  with Rehab Achilles tendinitis is a disorder of the Achilles tendon. The Achilles tendon connects the large calf muscles (Gastrocnemius and Soleus) to the heel bone (calcaneus). This tendon is sometimes called the heel cord. It is important for pushing-off and standing on your toes and is important for walking, running, or jumping. Tendinitis is often caused by overuse and repetitive microtrauma. SYMPTOMS  Pain, tenderness, swelling, warmth, and redness may occur over the Achilles tendon even at rest.  Pain with pushing off, or flexing or extending the ankle.  Pain that is worsened after or during activity. CAUSES   Overuse sometimes seen with rapid increase in exercise programs or in sports requiring running and jumping.  Poor physical conditioning (strength and flexibility or endurance).  Running sports, especially training running down hills.  Inadequate warm-up before practice or play or failure to stretch before participation.  Injury to the tendon. PREVENTION   Warm up and stretch before practice or competition.  Allow time for adequate rest and recovery between practices and competition.  Keep up conditioning.  Keep up ankle and leg flexibility.  Improve or keep muscle strength and endurance.  Improve cardiovascular fitness.  Use proper technique.  Use proper equipment (shoes, skates).  To help prevent recurrence, taping, protective strapping, or an adhesive bandage may be recommended for several weeks after healing is complete. PROGNOSIS   Recovery may take weeks to several months to heal.  Longer recovery is expected if symptoms have been prolonged.  Recovery is usually quicker if the inflammation is due to a direct blow as compared with overuse or sudden strain. RELATED COMPLICATIONS   Healing time will be prolonged if the condition is not correctly treated. The injury must be given plenty of time to heal.  Symptoms can reoccur if  activity is resumed too soon.  Untreated, tendinitis may increase the risk of tendon rupture requiring additional time for recovery and possibly surgery. TREATMENT   The first treatment consists of rest anti-inflammatory medication, and ice to relieve the pain.  Stretching and strengthening exercises after resolution of pain will likely help reduce the risk of recurrence. Referral to a physical therapist or athletic trainer for further evaluation and treatment may be helpful.  A walking boot or cast may be recommended to rest the Achilles tendon. This can help break the cycle of inflammation and microtrauma.  Arch supports (orthotics) may be prescribed or recommended by your caregiver as an adjunct to therapy and rest.  Surgery to remove the inflamed tendon lining or degenerated tendon tissue is rarely necessary and has shown less than predictable results. MEDICATION   Nonsteroidal anti-inflammatory medications, such as aspirin and ibuprofen, may be used for pain and inflammation relief. Do not take within 7 days before surgery. Take these as directed by your caregiver. Contact your caregiver immediately if any bleeding, stomach upset, or signs of allergic reaction occur. Other minor pain relievers, such as acetaminophen, may also be used.  Pain relievers may be prescribed as necessary by your caregiver. Do not take prescription pain medication for longer than 4 to 7 days. Use only as directed and only as much as you need.  Cortisone injections are rarely indicated. Cortisone injections may weaken tendons and predispose to rupture. It is better to give the condition more time to heal than to use them. HEAT AND COLD  Cold is used to relieve pain and reduce inflammation for acute and chronic Achilles tendinitis. Cold should be applied for 10 to 15 minutes   every 2 to 3 hours for inflammation and pain and immediately after any activity that aggravates your symptoms. Use ice packs or an ice  massage.  Heat may be used before performing stretching and strengthening activities prescribed by your caregiver. Use a heat pack or a warm soak. SEEK MEDICAL CARE IF:  Symptoms get worse or do not improve in 2 weeks despite treatment.  New, unexplained symptoms develop. Drugs used in treatment may produce side effects.  EXERCISES:  RANGE OF MOTION (ROM) AND STRETCHING EXERCISES - Achilles Tendinitis  These exercises may help you when beginning to rehabilitate your injury. Your symptoms may resolve with or without further involvement from your physician, physical therapist or athletic trainer. While completing these exercises, remember:   Restoring tissue flexibility helps normal motion to return to the joints. This allows healthier, less painful movement and activity.  An effective stretch should be held for at least 30 seconds.  A stretch should never be painful. You should only feel a gentle lengthening or release in the stretched tissue.  STRETCH  Gastroc, Standing   Place hands on wall.  Extend right / left leg, keeping the front knee somewhat bent.  Slightly point your toes inward on your back foot.  Keeping your right / left heel on the floor and your knee straight, shift your weight toward the wall, not allowing your back to arch.  You should feel a gentle stretch in the right / left calf. Hold this position for 10 seconds. Repeat 3 times. Complete this stretch 2 times per day.  STRETCH  Soleus, Standing   Place hands on wall.  Extend right / left leg, keeping the other knee somewhat bent.  Slightly point your toes inward on your back foot.  Keep your right / left heel on the floor, bend your back knee, and slightly shift your weight over the back leg so that you feel a gentle stretch deep in your back calf.  Hold this position for 10 seconds. Repeat 3 times. Complete this stretch 2 times per day.  STRETCH  Gastrocsoleus, Standing  Note: This exercise can place  a lot of stress on your foot and ankle. Please complete this exercise only if specifically instructed by your caregiver.   Place the ball of your right / left foot on a step, keeping your other foot firmly on the same step.  Hold on to the wall or a rail for balance.  Slowly lift your other foot, allowing your body weight to press your heel down over the edge of the step.  You should feel a stretch in your right / left calf.  Hold this position for 10 seconds.  Repeat this exercise with a slight bend in your knee. Repeat 3 times. Complete this stretch 2 times per day.   STRENGTHENING EXERCISES - Achilles Tendinitis These exercises may help you when beginning to rehabilitate your injury. They may resolve your symptoms with or without further involvement from your physician, physical therapist or athletic trainer. While completing these exercises, remember:   Muscles can gain both the endurance and the strength needed for everyday activities through controlled exercises.  Complete these exercises as instructed by your physician, physical therapist or athletic trainer. Progress the resistance and repetitions only as guided.  You may experience muscle soreness or fatigue, but the pain or discomfort you are trying to eliminate should never worsen during these exercises. If this pain does worsen, stop and make certain you are following the directions exactly. If   the pain is still present after adjustments, discontinue the exercise until you can discuss the trouble with your clinician.  STRENGTH - Plantar-flexors   Sit with your right / left leg extended. Holding onto both ends of a rubber exercise band/tubing, loop it around the ball of your foot. Keep a slight tension in the band.  Slowly push your toes away from you, pointing them downward.  Hold this position for 10 seconds. Return slowly, controlling the tension in the band/tubing. Repeat 3 times. Complete this exercise 2 times per day.     STRENGTH - Plantar-flexors   Stand with your feet shoulder width apart. Steady yourself with a wall or table using as little support as needed.  Keeping your weight evenly spread over the width of your feet, rise up on your toes.*  Hold this position for 10 seconds. Repeat 3 times. Complete this exercise 2 times per day.  *If this is too easy, shift your weight toward your right / left leg until you feel challenged. Ultimately, you may be asked to do this exercise with your right / left foot only.  STRENGTH  Plantar-flexors, Eccentric  Note: This exercise can place a lot of stress on your foot and ankle. Please complete this exercise only if specifically instructed by your caregiver.   Place the balls of your feet on a step. With your hands, use only enough support from a wall or rail to keep your balance.  Keep your knees straight and rise up on your toes.  Slowly shift your weight entirely to your right / left toes and pick up your opposite foot. Gently and with controlled movement, lower your weight through your right / left foot so that your heel drops below the level of the step. You will feel a slight stretch in the back of your calf at the end position.  Use the healthy leg to help rise up onto the balls of both feet, then lower weight only on the right / left leg again. Build up to 15 repetitions. Then progress to 3 consecutive sets of 15 repetitions.*  After completing the above exercise, complete the same exercise with a slight knee bend (about 30 degrees). Again, build up to 15 repetitions. Then progress to 3 consecutive sets of 15 repetitions.* Perform this exercise 2 times per day.  *When you easily complete 3 sets of 15, your physician, physical therapist or athletic trainer may advise you to add resistance by wearing a backpack filled with additional weight.  STRENGTH - Plantar Flexors, Seated   Sit on a chair that allows your feet to rest flat on the ground. If  necessary, sit at the edge of the chair.  Keeping your toes firmly on the ground, lift your right / left heel as far as you can without increasing any discomfort in your ankle. Repeat 3 times. Complete this exercise 2 times a day.    Plantar Fasciitis (Heel Spur Syndrome) with Rehab The plantar fascia is a fibrous, ligament-like, soft-tissue structure that spans the bottom of the foot. Plantar fasciitis is a condition that causes pain in the foot due to inflammation of the tissue. SYMPTOMS   Pain and tenderness on the underneath side of the foot.  Pain that worsens with standing or walking. CAUSES  Plantar fasciitis is caused by irritation and injury to the plantar fascia on the underneath side of the foot. Common mechanisms of injury include:  Direct trauma to bottom of the foot.  Damage to a small   nerve that runs under the foot where the main fascia attaches to the heel bone.  Stress placed on the plantar fascia due to bone spurs. RISK INCREASES WITH:   Activities that place stress on the plantar fascia (running, jumping, pivoting, or cutting).  Poor strength and flexibility.  Improperly fitted shoes.  Tight calf muscles.  Flat feet.  Failure to warm-up properly before activity.  Obesity. PREVENTION  Warm up and stretch properly before activity.  Allow for adequate recovery between workouts.  Maintain physical fitness:  Strength, flexibility, and endurance.  Cardiovascular fitness.  Maintain a health body weight.  Avoid stress on the plantar fascia.  Wear properly fitted shoes, including arch supports for individuals who have flat feet.  PROGNOSIS  If treated properly, then the symptoms of plantar fasciitis usually resolve without surgery. However, occasionally surgery is necessary.  RELATED COMPLICATIONS   Recurrent symptoms that may result in a chronic condition.  Problems of the lower back that are caused by compensating for the injury, such as  limping.  Pain or weakness of the foot during push-off following surgery.  Chronic inflammation, scarring, and partial or complete fascia tear, occurring more often from repeated injections.  TREATMENT  Treatment initially involves the use of ice and medication to help reduce pain and inflammation. The use of strengthening and stretching exercises may help reduce pain with activity, especially stretches of the Achilles tendon. These exercises may be performed at home or with a therapist. Your caregiver may recommend that you use heel cups of arch supports to help reduce stress on the plantar fascia. Occasionally, corticosteroid injections are given to reduce inflammation. If symptoms persist for greater than 6 months despite non-surgical (conservative), then surgery may be recommended.   MEDICATION   If pain medication is necessary, then nonsteroidal anti-inflammatory medications, such as aspirin and ibuprofen, or other minor pain relievers, such as acetaminophen, are often recommended.  Do not take pain medication within 7 days before surgery.  Prescription pain relievers may be given if deemed necessary by your caregiver. Use only as directed and only as much as you need.  Corticosteroid injections may be given by your caregiver. These injections should be reserved for the most serious cases, because they may only be given a certain number of times.  HEAT AND COLD  Cold treatment (icing) relieves pain and reduces inflammation. Cold treatment should be applied for 10 to 15 minutes every 2 to 3 hours for inflammation and pain and immediately after any activity that aggravates your symptoms. Use ice packs or massage the area with a piece of ice (ice massage).  Heat treatment may be used prior to performing the stretching and strengthening activities prescribed by your caregiver, physical therapist, or athletic trainer. Use a heat pack or soak the injury in warm water.  SEEK IMMEDIATE MEDICAL  CARE IF:  Treatment seems to offer no benefit, or the condition worsens.  Any medications produce adverse side effects.  EXERCISES- RANGE OF MOTION (ROM) AND STRETCHING EXERCISES - Plantar Fasciitis (Heel Spur Syndrome) These exercises may help you when beginning to rehabilitate your injury. Your symptoms may resolve with or without further involvement from your physician, physical therapist or athletic trainer. While completing these exercises, remember:   Restoring tissue flexibility helps normal motion to return to the joints. This allows healthier, less painful movement and activity.  An effective stretch should be held for at least 30 seconds.  A stretch should never be painful. You should only feel a gentle lengthening   or release in the stretched tissue.  RANGE OF MOTION - Toe Extension, Flexion  Sit with your right / left leg crossed over your opposite knee.  Grasp your toes and gently pull them back toward the top of your foot. You should feel a stretch on the bottom of your toes and/or foot.  Hold this stretch for 10 seconds.  Now, gently pull your toes toward the bottom of your foot. You should feel a stretch on the top of your toes and or foot.  Hold this stretch for 10 seconds. Repeat  times. Complete this stretch 3 times per day.   RANGE OF MOTION - Ankle Dorsiflexion, Active Assisted  Remove shoes and sit on a chair that is preferably not on a carpeted surface.  Place right / left foot under knee. Extend your opposite leg for support.  Keeping your heel down, slide your right / left foot back toward the chair until you feel a stretch at your ankle or calf. If you do not feel a stretch, slide your bottom forward to the edge of the chair, while still keeping your heel down.  Hold this stretch for 10 seconds. Repeat 3 times. Complete this stretch 2 times per day.   STRETCH  Gastroc, Standing  Place hands on wall.  Extend right / left leg, keeping the front knee  somewhat bent.  Slightly point your toes inward on your back foot.  Keeping your right / left heel on the floor and your knee straight, shift your weight toward the wall, not allowing your back to arch.  You should feel a gentle stretch in the right / left calf. Hold this position for 10 seconds. Repeat 3 times. Complete this stretch 2 times per day.  STRETCH  Soleus, Standing  Place hands on wall.  Extend right / left leg, keeping the other knee somewhat bent.  Slightly point your toes inward on your back foot.  Keep your right / left heel on the floor, bend your back knee, and slightly shift your weight over the back leg so that you feel a gentle stretch deep in your back calf.  Hold this position for 10 seconds. Repeat 3 times. Complete this stretch 2 times per day.  STRETCH  Gastrocsoleus, Standing  Note: This exercise can place a lot of stress on your foot and ankle. Please complete this exercise only if specifically instructed by your caregiver.   Place the ball of your right / left foot on a step, keeping your other foot firmly on the same step.  Hold on to the wall or a rail for balance.  Slowly lift your other foot, allowing your body weight to press your heel down over the edge of the step.  You should feel a stretch in your right / left calf.  Hold this position for 10 seconds.  Repeat this exercise with a slight bend in your right / left knee. Repeat 3 times. Complete this stretch 2 times per day.   STRENGTHENING EXERCISES - Plantar Fasciitis (Heel Spur Syndrome)  These exercises may help you when beginning to rehabilitate your injury. They may resolve your symptoms with or without further involvement from your physician, physical therapist or athletic trainer. While completing these exercises, remember:   Muscles can gain both the endurance and the strength needed for everyday activities through controlled exercises.  Complete these exercises as instructed by  your physician, physical therapist or athletic trainer. Progress the resistance and repetitions only as guided.  STRENGTH -   Towel Curls  Sit in a chair positioned on a non-carpeted surface.  Place your foot on a towel, keeping your heel on the floor.  Pull the towel toward your heel by only curling your toes. Keep your heel on the floor. Repeat 3 times. Complete this exercise 2 times per day.  STRENGTH - Ankle Inversion  Secure one end of a rubber exercise band/tubing to a fixed object (table, pole). Loop the other end around your foot just before your toes.  Place your fists between your knees. This will focus your strengthening at your ankle.  Slowly, pull your big toe up and in, making sure the band/tubing is positioned to resist the entire motion.  Hold this position for 10 seconds.  Have your muscles resist the band/tubing as it slowly pulls your foot back to the starting position. Repeat 3 times. Complete this exercises 2 times per day.  Document Released: 08/26/2005 Document Revised: 11/18/2011 Document Reviewed: 12/08/2008 [employer] Patient Information 2014 [employer]  

## 2014-10-26 ENCOUNTER — Encounter: Payer: Self-pay | Admitting: Cardiology

## 2015-02-01 ENCOUNTER — Encounter: Payer: Self-pay | Admitting: Cardiology

## 2015-02-06 NOTE — Progress Notes (Signed)
Cardiology Office Note   Date:  02/07/2015   ID:  [name], DOB [birth year], MRN [id number]  PCP:  [place], BETTY G, MD    Chief Complaint  Patient presents with  . Follow-up    essential hypertension, CHF, hyperlipidemia      History of Present Illness: [name] is a 74 y.o. female with a history of chronic diastolic CHF, normal LVF, normal coronary arteries by cath 2010, HTN and dyslipidemia who presents today for followup. She is doing well. She denies any dizziness or syncope.  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 She occasionally has some LE edema if on her feet a long time. She has chronic DOE when climbing steps which is stable.   She denies any chest pain or pressure.  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  She also notices that occasionally she will have a fluttering in her chest that feels like it catches her breath.     Past Medical History  Diagnosis Date  . Gastric reflux   . Obesity   . GERD (gastroesophageal reflux disease)   . Hypertension   . Hyperlipemia     dyslipidemia  . Diastolic dysfunction   . Chronic diastolic CHF (congestive heart failure)     Past Surgical History  Procedure Laterality Date  . Shoulder arthroscopy    . Neck surgery    . Oophorectomy    . Abdominal hysterectomy    . Cardiac catheterization  02/2009    no evidence of CAD/ Elevated LVEDP at the time ofcath c/w diastolic dysfunction  . Appendectomy  1977     Current Outpatient Prescriptions  Medication Sig Dispense Refill  . aspirin 81 MG tablet Take 81 mg by mouth daily.    Marland Kitchen. estradiol (ESTRACE) 0.5 MG tablet   6  . ESTRADIOL ACETATE PO Take 0.5 tablets by mouth daily.     . furosemide (LASIX) 20 MG tablet Take 1 tablet (20 mg total) by mouth daily. 90 tablet 3  . lisinopril (PRINIVIL,ZESTRIL) 10 MG tablet Take 1 tablet (10 mg total) by mouth daily. 90  tablet 3  . lovastatin (MEVACOR) 40 MG tablet Take 1 tablet (40 mg total) by mouth at bedtime. 90 tablet 3  . meloxicam (MOBIC) 15 MG tablet Take 1 tablet (15 mg total) by mouth daily. 30 tablet 1  . meloxicam (MOBIC) 7.5 MG tablet Take 7.5 mg by mouth daily.    . metoprolol succinate (TOPROL-XL) 25 MG 24 hr tablet Take 1 tablet (25 mg total) by mouth daily. 90 tablet 3  . nystatin-triamcinolone (MYCOLOG II) cream Apply 1 application topically daily.  0  . Omega-3 Fatty Acids (FISH OIL) 1000 MG CAPS Take by mouth daily.    Marland Kitchen. omeprazole (PRILOSEC) 20 MG capsule Take 1 capsule (20 mg total) by mouth daily. 30 capsule 3  . prednisoLONE acetate (PRED FORTE) 1 % ophthalmic suspension Place 1 drop into the right eye 3 (three) times daily.  1  . Probiotic Product (PROBIOTIC DAILY PO) Take by mouth daily.    Marland Kitchen. terconazole (TERAZOL 7) 0.4 % vaginal cream Place 1 applicator vaginally at bedtime.  0  . zolpidem (AMBIEN) 10 MG tablet Take 5 mg by mouth at bedtime as needed for sleep.     . [DISCONTINUED] Omeprazole (PRILOSEC PO) Take  20 mg by mouth daily.      No current facility-administered medications for this visit.    Allergies:   Morphine and related    Social History:  The patient  reports that she has never smoked. She does not have any smokeless tobacco history on file. She reports that she does not drink alcohol or use illicit drugs.   Family History:  The patient's family history includes CAD in her brother, brother, brother, father, and sister; Pancreatic cancer in her brother.    ROS:  Please see the history of present illness.   Otherwise, review of systems are positive for none.   All other systems are reviewed and negative.    PHYSICAL EXAM: VS:  BP 150/102 mmHg  Pulse 54  Ht 5' 2.5" (1.588 m)  Wt 204 lb (92.534 kg)  BMI 36.69 kg/m2 , BMI Body mass index is 36.69 kg/(m^2). GEN: Well nourished, well developed, in no acute distress HEENT: normal Neck: no JVD, carotid bruits, or  masses Cardiac: RRR; no murmurs, rubs, or gallops,no edema  Respiratory:  clear to auscultation bilaterally, normal work of breathing GI: soft, nontender, nondistended, + BS MS: no deformity or atrophy Skin: warm and dry, no rash Neuro:  Strength and sensation are intact Psych: euthymic mood, full affect   EKG:  EKG was ordered today and showed sinus bradycardia at 54bpm with nonspecific T wave abnormality.    Recent Labs: No results found for requested labs within last 365 days.    Lipid Panel    Component Value Date/Time   CHOL 137 01/18/2014 0932   TRIG 142.0 01/18/2014 0932   HDL 36.40* 01/18/2014 0932   CHOLHDL 4 01/18/2014 0932   VLDL 28.4 01/18/2014 0932   LDLCALC 72 01/18/2014 0932      Wt Readings from Last 3 Encounters:  02/07/15 204 lb (92.534 kg)  08/23/14 203 lb (92.08 kg)  01/18/14 7 lb 12.8 oz (3.538 kg)     ASSESSMENT AND PLAN:  1. Chronic diastolic CHF well compensated - continue Lisinopril/lasix/Metoprolol 2. HTN borderline controlled - she has not taken her BP meds today.  Her BP was 120/52mmHg as last OV with her PCP. - continue Metoprolol/Lisinopril 3. Dyslipidemia - check fasting lipids and ALT - continue Lovastatin/fish oil 4. Normal coronary arteries by cath 2010 5. Exertional fatigue and DOE that are new for her.  I will get a Stress myoview to rule out ischemia. 6. Palpitations - I will get an event monitor to rule out PAF.  I suspect she is having PAC's or PVC's   Current medicines are reviewed at length with the patient today.  The patient does not have concerns regarding medicines.  The following changes have been made:  no change  Labs/ tests ordered today: See above Assessment and Plan No orders of the defined types were placed in this encounter.     Disposition:   FU with me in 6 months  Signed, Quintella Reichert, MD  02/07/2015 2:49 PM    [facility] [address] [place], [place], [place]  [address] Phone:  [phone]; Fax: [phone]

## 2015-02-07 ENCOUNTER — Ambulatory Visit (INDEPENDENT_AMBULATORY_CARE_PROVIDER_SITE_OTHER): Payer: Medicare Other | Admitting: Cardiology

## 2015-02-07 ENCOUNTER — Encounter: Payer: Self-pay | Admitting: Cardiology

## 2015-02-07 ENCOUNTER — Other Ambulatory Visit (INDEPENDENT_AMBULATORY_CARE_PROVIDER_SITE_OTHER): Payer: Medicare Other | Admitting: *Deleted

## 2015-02-07 VITALS — BP 150/102 | HR 54 | Ht 62.5 in | Wt 204.0 lb

## 2015-02-07 DIAGNOSIS — E785 Hyperlipidemia, unspecified: Secondary | ICD-10-CM

## 2015-02-07 DIAGNOSIS — R0602 Shortness of breath: Secondary | ICD-10-CM

## 2015-02-07 DIAGNOSIS — I10 Essential (primary) hypertension: Secondary | ICD-10-CM | POA: Diagnosis not present

## 2015-02-07 DIAGNOSIS — T733XXA Exhaustion due to excessive exertion, initial encounter: Secondary | ICD-10-CM

## 2015-02-07 DIAGNOSIS — I5032 Chronic diastolic (congestive) heart failure: Secondary | ICD-10-CM

## 2015-02-07 DIAGNOSIS — R002 Palpitations: Secondary | ICD-10-CM

## 2015-02-07 LAB — LIPID PANEL
Cholesterol: 157 mg/dL (ref 0–200)
HDL: 38.4 mg/dL — ABNORMAL LOW (ref >39.00)
LDL CALC: 90 mg/dL (ref 0–99)
NonHDL: 118.6
Total CHOL/HDL Ratio: 4
Triglycerides: 142 mg/dL (ref 0.0–149.0)
VLDL: 28.4 mg/dL (ref 0.0–40.0)

## 2015-02-07 LAB — HEPATIC FUNCTION PANEL
ALK PHOS: 63 U/L (ref 39–117)
ALT: 34 U/L (ref 0–35)
AST: 32 U/L (ref 0–37)
Albumin: 4.4 g/dL (ref 3.5–5.2)
BILIRUBIN DIRECT: 0.1 mg/dL (ref 0.0–0.3)
Total Bilirubin: 0.8 mg/dL (ref 0.2–1.2)
Total Protein: 7.1 g/dL (ref 6.0–8.3)

## 2015-02-07 NOTE — Telephone Encounter (Signed)
This encounter was created in error - please disregard.

## 2015-02-07 NOTE — Patient Instructions (Addendum)
Medication Instructions:  Your physician recommends that you continue on your current medications as directed. Please refer to the Current Medication list given to you today.   Labwork: None  Testing/Procedures: Your physician has requested that you have an echocardiogram. Echocardiography is a painless test that uses sound waves to create images of your heart. It provides your doctor with information about the size and shape of your heart and how well your heart's chambers and valves are working. This procedure takes approximately one hour. There are no restrictions for this procedure.   Dr. Turner recommends you have an EXERCISE MYOVIEW.  Your physician has recommended that you wear an event monitor. Event monitors are medical devices that record the heart's electrical activity. Doctors most often us these monitors to diagnose arrhythmias. Arrhythmias are problems with the speed or rhythm of the heartbeat. The monitor is a small, portable device. You can wear one while you do your normal daily activities. This is usually used to diagnose what is causing palpitations/syncope (passing out).  Follow-Up: Your physician wants you to follow-up in: 6 months with Dr. Turner. You will receive a reminder letter in the mail two months in advance. If you don't receive a letter, please call our office to schedule the follow-up appointment.   Any Other Special Instructions Will Be Listed Below (If Applicable).   

## 2015-02-20 ENCOUNTER — Other Ambulatory Visit: Payer: Self-pay

## 2015-02-20 MED ORDER — OMEPRAZOLE 20 MG PO CPDR: 20.0000 mg | DELAYED_RELEASE_CAPSULE | Freq: Every day | ORAL | Status: DC

## 2015-02-21 ENCOUNTER — Telehealth (HOSPITAL_COMMUNITY): Payer: Self-pay | Admitting: *Deleted

## 2015-02-21 ENCOUNTER — Other Ambulatory Visit: Payer: Self-pay | Admitting: *Deleted

## 2015-02-21 MED ORDER — LOVASTATIN 40 MG PO TABS: 40.0000 mg | ORAL_TABLET | Freq: Every day | ORAL | Status: DC

## 2015-02-21 MED ORDER — METOPROLOL SUCCINATE ER 25 MG PO TB24: 25.0000 mg | ORAL_TABLET | Freq: Every day | ORAL | Status: DC

## 2015-02-21 NOTE — Telephone Encounter (Signed)
Patient given detailed instructions per Myocardial Perfusion Study Information Sheet for test on 02/27/15 at 0900. Patient Notified to arrive 15 minutes early, and that it is imperative to arrive on time for appointment to keep from having the test rescheduled. Patient verbalized understanding. [name], Adelene Idler

## 2015-02-22 ENCOUNTER — Telehealth (HOSPITAL_COMMUNITY): Payer: Self-pay | Admitting: *Deleted

## 2015-02-22 NOTE — Telephone Encounter (Signed)
Left message on voicemail in reference to upcoming appointment scheduled for 02/27/15. Phone number given for a call back so details instructions can be given.Kaylan Friedmann J Chaun Uemura, RN 

## 2015-02-24 ENCOUNTER — Other Ambulatory Visit: Payer: Self-pay | Admitting: *Deleted

## 2015-02-24 MED ORDER — MELOXICAM 7.5 MG PO TABS: 7.5000 mg | ORAL_TABLET | Freq: Every day | ORAL | Status: DC

## 2015-02-24 MED ORDER — LOVASTATIN 40 MG PO TABS: 40.0000 mg | ORAL_TABLET | Freq: Every day | ORAL | Status: DC

## 2015-02-24 MED ORDER — LISINOPRIL 10 MG PO TABS: 10.0000 mg | ORAL_TABLET | Freq: Every day | ORAL | Status: DC

## 2015-02-24 MED ORDER — OMEPRAZOLE 20 MG PO CPDR
20.0000 mg | DELAYED_RELEASE_CAPSULE | Freq: Every day | ORAL | Status: DC
Start: 2015-02-24 — End: 2016-03-18

## 2015-02-24 MED ORDER — FUROSEMIDE 20 MG PO TABS: 20.0000 mg | ORAL_TABLET | Freq: Every day | ORAL | Status: DC

## 2015-02-24 MED ORDER — METOPROLOL SUCCINATE ER 25 MG PO TB24: 25.0000 mg | ORAL_TABLET | Freq: Every day | ORAL | Status: DC

## 2015-02-27 ENCOUNTER — Other Ambulatory Visit: Payer: Self-pay

## 2015-02-27 ENCOUNTER — Telehealth: Payer: Self-pay

## 2015-02-27 ENCOUNTER — Ambulatory Visit (HOSPITAL_BASED_OUTPATIENT_CLINIC_OR_DEPARTMENT_OTHER): Payer: Medicare Other

## 2015-02-27 ENCOUNTER — Ambulatory Visit (HOSPITAL_COMMUNITY): Payer: Medicare Other | Attending: Cardiology

## 2015-02-27 ENCOUNTER — Ambulatory Visit (INDEPENDENT_AMBULATORY_CARE_PROVIDER_SITE_OTHER): Payer: Medicare Other

## 2015-02-27 DIAGNOSIS — Z6837 Body mass index (BMI) 37.0-37.9, adult: Secondary | ICD-10-CM | POA: Diagnosis not present

## 2015-02-27 DIAGNOSIS — I10 Essential (primary) hypertension: Secondary | ICD-10-CM | POA: Diagnosis not present

## 2015-02-27 DIAGNOSIS — I081 Rheumatic disorders of both mitral and tricuspid valves: Secondary | ICD-10-CM | POA: Diagnosis not present

## 2015-02-27 DIAGNOSIS — R002 Palpitations: Secondary | ICD-10-CM | POA: Diagnosis not present

## 2015-02-27 DIAGNOSIS — E785 Hyperlipidemia, unspecified: Secondary | ICD-10-CM | POA: Diagnosis not present

## 2015-02-27 DIAGNOSIS — E669 Obesity, unspecified: Secondary | ICD-10-CM | POA: Diagnosis not present

## 2015-02-27 DIAGNOSIS — R0602 Shortness of breath: Secondary | ICD-10-CM

## 2015-02-27 DIAGNOSIS — I509 Heart failure, unspecified: Secondary | ICD-10-CM | POA: Insufficient documentation

## 2015-02-27 MED ORDER — LOVASTATIN 40 MG PO TABS: 40.0000 mg | ORAL_TABLET | Freq: Every day | ORAL | Status: DC

## 2015-02-27 MED ORDER — REGADENOSON 0.4 MG/5ML IV SOLN
0.4000 mg | Freq: Once | INTRAVENOUS | Status: AC
  Administered 2015-02-27: 0.4 mg via INTRAVENOUS

## 2015-02-27 MED ORDER — METOPROLOL SUCCINATE ER 25 MG PO TB24
25.0000 mg | ORAL_TABLET | Freq: Every day | ORAL | Status: DC
Start: 2015-02-27 — End: 2016-03-18

## 2015-02-27 MED ORDER — LISINOPRIL 10 MG PO TABS: 10.0000 mg | ORAL_TABLET | Freq: Every day | ORAL | Status: DC

## 2015-02-27 MED ORDER — TECHNETIUM TC 99M SESTAMIBI GENERIC - CARDIOLITE
33.0000 | Freq: Once | INTRAVENOUS | Status: AC | PRN
  Administered 2015-02-27: 33 via INTRAVENOUS

## 2015-02-27 MED ORDER — FUROSEMIDE 20 MG PO TABS: 20.0000 mg | ORAL_TABLET | Freq: Every day | ORAL | Status: DC

## 2015-02-27 MED ORDER — TECHNETIUM TC 99M SESTAMIBI GENERIC - CARDIOLITE
11.0000 | Freq: Once | INTRAVENOUS | Status: AC | PRN
  Administered 2015-02-27: 11 via INTRAVENOUS

## 2015-02-27 NOTE — Telephone Encounter (Signed)
[name], monitor tech, st patient is in the office and requesting refills of Lisinopril, Metoprolol, Lovastatin, and furosemide to [facility]. Refills sent.

## 2015-02-28 ENCOUNTER — Telehealth: Payer: Self-pay | Admitting: *Deleted

## 2015-02-28 DIAGNOSIS — R0602 Shortness of breath: Secondary | ICD-10-CM

## 2015-02-28 LAB — MYOCARDIAL PERFUSION IMAGING
CHL CUP RESTING HR STRESS: 52 {beats}/min
CSEPEW: 7 METS
CSEPHR: 71 %
Exercise duration (min): 8 min
Exercise duration (sec): 30 s
LV sys vol: 24 mL
LVDIAVOL: 90 mL
MPHR: 149 {beats}/min
NUC STRESS TID: 0.94
Peak HR: 107 {beats}/min
RATE: 0.38
SDS: 4
SRS: 0
SSS: 4

## 2015-02-28 NOTE — Telephone Encounter (Signed)
Spoke with pt and informed her that Dr. Mayford Knife would like for her to have a BNP drawn, Scheduled appt for 6/27. Pt verbalized understanding and was in agreement with this plan.

## 2015-02-28 NOTE — Telephone Encounter (Signed)
-----   Message from Quintella Reichert, MD sent at 02/28/2015 12:49 PM EDT ----- Please let patient know that stress test was fine

## 2015-03-06 ENCOUNTER — Other Ambulatory Visit (INDEPENDENT_AMBULATORY_CARE_PROVIDER_SITE_OTHER): Payer: Medicare Other | Admitting: *Deleted

## 2015-03-06 DIAGNOSIS — R0602 Shortness of breath: Secondary | ICD-10-CM | POA: Diagnosis not present

## 2015-03-06 LAB — BRAIN NATRIURETIC PEPTIDE: Pro B Natriuretic peptide (BNP): 81 pg/mL (ref 0.0–100.0)

## 2015-06-12 ENCOUNTER — Other Ambulatory Visit (HOSPITAL_BASED_OUTPATIENT_CLINIC_OR_DEPARTMENT_OTHER): Payer: Self-pay | Admitting: Family Medicine

## 2015-06-12 DIAGNOSIS — Z1231 Encounter for screening mammogram for malignant neoplasm of breast: Secondary | ICD-10-CM

## 2015-06-19 ENCOUNTER — Ambulatory Visit (HOSPITAL_BASED_OUTPATIENT_CLINIC_OR_DEPARTMENT_OTHER)
Admission: RE | Admit: 2015-06-19 | Discharge: 2015-06-19 | Disposition: A | Discharge status: Home / Self Care | Payer: Medicare Other | Source: Ambulatory Visit | Attending: Family Medicine | Admitting: Family Medicine

## 2015-06-19 DIAGNOSIS — Z1231 Encounter for screening mammogram for malignant neoplasm of breast: Secondary | ICD-10-CM | POA: Diagnosis not present

## 2015-11-13 DIAGNOSIS — H35371 Puckering of macula, right eye: Secondary | ICD-10-CM | POA: Diagnosis not present

## 2015-11-13 DIAGNOSIS — H35032 Hypertensive retinopathy, left eye: Secondary | ICD-10-CM | POA: Diagnosis not present

## 2015-11-13 DIAGNOSIS — H26491 Other secondary cataract, right eye: Secondary | ICD-10-CM | POA: Diagnosis not present

## 2015-11-13 DIAGNOSIS — H40013 Open angle with borderline findings, low risk, bilateral: Secondary | ICD-10-CM | POA: Diagnosis not present

## 2015-11-13 DIAGNOSIS — H26492 Other secondary cataract, left eye: Secondary | ICD-10-CM | POA: Diagnosis not present

## 2015-11-13 DIAGNOSIS — H35031 Hypertensive retinopathy, right eye: Secondary | ICD-10-CM | POA: Diagnosis not present

## 2015-11-20 DIAGNOSIS — S83241A Other tear of medial meniscus, current injury, right knee, initial encounter: Secondary | ICD-10-CM | POA: Diagnosis not present

## 2015-11-20 DIAGNOSIS — S46011A Strain of muscle(s) and tendon(s) of the rotator cuff of right shoulder, initial encounter: Secondary | ICD-10-CM | POA: Diagnosis not present

## 2015-11-22 DIAGNOSIS — L821 Other seborrheic keratosis: Secondary | ICD-10-CM | POA: Diagnosis not present

## 2015-11-22 DIAGNOSIS — L718 Other rosacea: Secondary | ICD-10-CM | POA: Diagnosis not present

## 2015-11-22 DIAGNOSIS — L570 Actinic keratosis: Secondary | ICD-10-CM | POA: Diagnosis not present

## 2015-11-29 DIAGNOSIS — K219 Gastro-esophageal reflux disease without esophagitis: Secondary | ICD-10-CM | POA: Diagnosis not present

## 2015-11-29 DIAGNOSIS — M2550 Pain in unspecified joint: Secondary | ICD-10-CM | POA: Diagnosis not present

## 2015-11-29 DIAGNOSIS — E785 Hyperlipidemia, unspecified: Secondary | ICD-10-CM | POA: Diagnosis not present

## 2015-11-29 DIAGNOSIS — N951 Menopausal and female climacteric states: Secondary | ICD-10-CM | POA: Diagnosis not present

## 2015-11-29 DIAGNOSIS — G4700 Insomnia, unspecified: Secondary | ICD-10-CM | POA: Diagnosis not present

## 2015-11-29 DIAGNOSIS — I10 Essential (primary) hypertension: Secondary | ICD-10-CM | POA: Diagnosis not present

## 2016-02-26 DIAGNOSIS — S46011D Strain of muscle(s) and tendon(s) of the rotator cuff of right shoulder, subsequent encounter: Secondary | ICD-10-CM | POA: Diagnosis not present

## 2016-03-06 DIAGNOSIS — M25511 Pain in right shoulder: Secondary | ICD-10-CM | POA: Diagnosis not present

## 2016-03-14 ENCOUNTER — Telehealth: Payer: Self-pay | Admitting: Cardiology

## 2016-03-14 NOTE — Telephone Encounter (Signed)
New message     The pt wants to know if blood work needs to be done the day of the appt.

## 2016-03-14 NOTE — Telephone Encounter (Signed)
Encouraged patient to fast for appointment as [name] may want lipid panel drawn (has not been checked since May 2016). Patient was grateful for call.

## 2016-03-17 NOTE — Progress Notes (Signed)
Cardiology Office Note   Date:  03/18/2016   ID:  [name], DOB Jan 23, 1941, MRN [id number]  PCP:  Betty [place], MD  Cardiologist:  Dr. Mayford Knife.     Chief Complaint  Patient presents with  . Hypertension      History of Present Illness: [name] is a 75 y.o. female who presents for HTN follow up- she has been out of meds for 2 weeks.   She has a sterong family hx of CAD and she has a history of chronic diastolic CHF, normal LVF, normal coronary arteries by cath 2010, HTN and dyslipidemia who presents today for followup. She is doing well. She denies any dizziness or syncope. 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 She occasionally has some LE edema if on her feet a long time. She has chronic DOE when climbing steps which is stable. She denies any chest pain or pressure.   Needs lipids and liver.    Her HR is 50 today but no lightheadedness or dizziness.  Last visit her HR was 54.  But she has been off BB for 2 weeks, will decrease dose of BB.     Past Medical History  Diagnosis Date  . Gastric reflux   . Obesity   . GERD (gastroesophageal reflux disease)   . Hypertension   . Hyperlipemia     dyslipidemia  . Diastolic dysfunction   . Chronic diastolic CHF (congestive heart failure) [facility])     Past Surgical History  Procedure Laterality Date  . Shoulder arthroscopy    . Neck surgery    . Oophorectomy    . Abdominal hysterectomy    . Cardiac catheterization  02/2009    no evidence of CAD/ Elevated LVEDP at the time ofcath c/w diastolic dysfunction  . Appendectomy  1977     Current Outpatient Prescriptions  Medication Sig Dispense Refill  . aspirin 81 MG tablet Take 81 mg by mouth daily.    . furosemide (LASIX) 20 MG tablet Take 1 tablet (20 mg total) by mouth daily. 90 tablet 3  . lisinopril (PRINIVIL,ZESTRIL) 10 MG tablet Take 1 tablet (10 mg total) by mouth daily. 90 tablet 3  . lovastatin (MEVACOR) 40 MG tablet  Take 1 tablet (40 mg total) by mouth at bedtime. 90 tablet 3  . meloxicam (MOBIC) 7.5 MG tablet Take 1 tablet (7.5 mg total) by mouth daily. 90 tablet 3  . metoprolol succinate (TOPROL-XL) 25 MG 24 hr tablet Take 0.5 tablets (12.5 mg total) by mouth daily. 45 tablet 3  . Multiple Vitamins-Minerals (WOMENS MULTI VITAMIN & MINERAL) TABS Take 1 tablet by mouth daily.    . Omega-3 Fatty Acids (FISH OIL) 1000 MG CAPS Take by mouth daily.    . Omega-3 Fatty Acids (FISH OIL) 1200 MG CAPS Take 1,200 mg by mouth 2 (two) times daily. TOTAL OF 2,400 MG DAILY    . Probiotic Product (PROBIOTIC DAILY PO) Take by mouth daily.    Marland Kitchen zolpidem (AMBIEN) 10 MG tablet Take 5 mg by mouth at bedtime as needed for sleep.     . [DISCONTINUED] Omeprazole (PRILOSEC PO) Take 20 mg by mouth daily.      No current facility-administered medications for this visit.    Allergies:   Morphine and related    Social History:  The patient  reports that she has never smoked. She does not have any smokeless tobacco history on file. She reports that she does not drink  alcohol or use illicit drugs.   Family History:  The patient's family history includes CAD in her brother, brother, brother, father, and sister; Heart attack in her father; Heart failure in her mother and sister; Other in her brother and sister; Pancreatic cancer in her brother.    ROS:  General:no colds or fevers, no weight changes Skin:no rashes or ulcers HEENT:no blurred vision, no congestion CV:see HPI PUL:see HPI GI:no diarrhea constipation or melena, no indigestion GU:no hematuria, no dysuria MS:no joint pain, no claudication Neuro:no syncope, no lightheadedness Endo:no diabetes, no thyroid disease  Wt Readings from Last 3 Encounters:  03/18/16 205 lb 6.4 oz (93.169 kg)  02/27/15 204 lb (92.534 kg)  02/07/15 204 lb (92.534 kg)     PHYSICAL EXAM: VS:  BP 140/81 mmHg  Pulse 50  Ht 5\' 2"  (1.575 m)  Wt 205 lb 6.4 oz (93.169 kg)  BMI 37.56 kg/m2 ,  BMI Body mass index is 37.56 kg/(m^2). General:Pleasant affect, NAD Skin:Warm and dry, brisk capillary refill HEENT:normocephalic, sclera clear, mucus membranes moist Neck:supple, no JVD, no bruits  Heart:S1S2 RRR without murmur, gallup, rub or click Lungs:clear without rales, rhonchi, or wheezes ZOX:WRUEAbd:soft, non tender, + BS, do not palpate liver spleen or masses Ext:tr lower ext edema, 2+ pedal pulses, 2+ radial pulses Neuro:alert and oriented X 3, MAE, follows commands, + facial symmetry    EKG:  EKG is ordered today. The ekg ordered today demonstrates SBrady at 50 non specific t wave changes.  No acute changes   Recent Labs: No results found for requested labs within last 365 days.    Lipid Panel    Component Value Date/Time   CHOL 157 02/07/2015 1521   TRIG 142.0 02/07/2015 1521   HDL 38.40* 02/07/2015 1521   CHOLHDL 4 02/07/2015 1521   VLDL 28.4 02/07/2015 1521   LDLCALC 90 02/07/2015 1521       Other studies Reviewed: NUC study Additional studies/ records that were reviewed today include: . 1. Upsloping ST segment depression ST segment depression was noted during stress in the inferior and inferolateral leads (II, III, aVF, V5 and V6), and returning to baseline after less than 1 minute of recovery. 2. No T wave inversion was noted during stress. 3. The study is normal. 4. This is a low risk study. 5. The left ventricular ejection fraction is normal (55-65%).  ECHO: Study Conclusions  - Left ventricle: The cavity size was normal. There was mild  concentric hypertrophy. Systolic function was normal. The  estimated ejection fraction was in the range of 60% to 65%. Wall  motion was normal; there were no regional wall motion  abnormalities. Doppler parameters are consistent with abnormal  left ventricular relaxation (grade 1 diastolic dysfunction).  Doppler parameters are consistent with elevated ventricular  end-diastolic filling pressure. - Aortic  valve: There was no regurgitation. - Mitral valve: Mildly thickened leaflets . There was mild  regurgitation. - Left atrium: The atrium was normal in size. - Right ventricle: Systolic function was normal. - Tricuspid valve: There was mild regurgitation. - Pulmonic valve: There was no regurgitation. - Pulmonary arteries: Systolic pressure was within the normal  range. - Inferior vena cava: The vessel was normal in size. - Pericardium, extracardiac: There was no pericardial effusion.  Impressions:  - Normal biventricular size and systolic function.  Abnormal relaxation with mildly elevated filling pressures.  Mild mitral and tricuspid regurgitation.  Normal RVSP.  24 hr monitoring.  heart monitor was fine with occasional extra heart beats  from the top of the heart which are benign       ASSESSMENT AND PLAN:  6. Chronic diastolic CHF well compensated- discussed diastolic HF. - continue Lisinopril/lasix/Metoprolol- though BB at lower dose 12.5 daily.  Recheck BP and pulse in 1 week by RN  HTN borderline controlled - she has not taken her BP meds today. - continue Metoprolol/Lisinopril see above  3.  Dyslipidemia - check fasting lipids and CMP - continue lovastatin/fish oil 4. Normal coronary arteries by cath 2010--recent nuc negative for ischemia 5. Sinus bradycardia at 50 asymptomatic will resume BB but only 12.5 daily and recheck pulse in a week.  The following changes have been made:  See above Labs/ tests ordered today include:see above  Disposition:   FU:  see above  Signed, Nada Boozer, NP  03/18/2016 9:53 AM    [facility] [address] [place], [place], [place]  [address]/ [address] [place], [place] Phone: [phone]; Fax: [phone]  [phone]

## 2016-03-18 ENCOUNTER — Ambulatory Visit (INDEPENDENT_AMBULATORY_CARE_PROVIDER_SITE_OTHER): Payer: Medicare Other | Admitting: Cardiology

## 2016-03-18 ENCOUNTER — Encounter: Payer: Self-pay | Admitting: Cardiology

## 2016-03-18 VITALS — BP 140/81 | HR 50 | Ht 62.0 in | Wt 205.4 lb

## 2016-03-18 DIAGNOSIS — R001 Bradycardia, unspecified: Secondary | ICD-10-CM

## 2016-03-18 DIAGNOSIS — I5032 Chronic diastolic (congestive) heart failure: Secondary | ICD-10-CM

## 2016-03-18 DIAGNOSIS — I10 Essential (primary) hypertension: Secondary | ICD-10-CM | POA: Diagnosis not present

## 2016-03-18 DIAGNOSIS — E785 Hyperlipidemia, unspecified: Secondary | ICD-10-CM | POA: Diagnosis not present

## 2016-03-18 DIAGNOSIS — R0602 Shortness of breath: Secondary | ICD-10-CM

## 2016-03-18 LAB — COMPREHENSIVE METABOLIC PANEL
ALK PHOS: 66 U/L (ref 33–130)
ALT: 50 U/L — AB (ref 6–29)
AST: 32 U/L (ref 10–35)
Albumin: 4.3 g/dL (ref 3.6–5.1)
BUN: 24 mg/dL (ref 7–25)
CO2: 29 mmol/L (ref 20–31)
CREATININE: 0.7 mg/dL (ref 0.60–0.93)
Calcium: 9.2 mg/dL (ref 8.6–10.4)
Chloride: 105 mmol/L (ref 98–110)
Glucose, Bld: 104 mg/dL — ABNORMAL HIGH (ref 65–99)
Potassium: 4.7 mmol/L (ref 3.5–5.3)
SODIUM: 141 mmol/L (ref 135–146)
TOTAL PROTEIN: 6.6 g/dL (ref 6.1–8.1)
Total Bilirubin: 0.5 mg/dL (ref 0.2–1.2)

## 2016-03-18 LAB — LIPID PANEL
Cholesterol: 212 mg/dL — ABNORMAL HIGH (ref 125–200)
HDL: 47 mg/dL (ref >=46)
LDL CALC: 140 mg/dL — AB (ref <130)
Total CHOL/HDL Ratio: 4.5 Ratio (ref <=5.0)
Triglycerides: 126 mg/dL (ref <150)
VLDL: 25 mg/dL (ref <30)

## 2016-03-18 MED ORDER — METOPROLOL SUCCINATE ER 25 MG PO TB24: 12.5000 mg | ORAL_TABLET | Freq: Every day | ORAL | Status: DC

## 2016-03-18 MED ORDER — LISINOPRIL 10 MG PO TABS: 10.0000 mg | ORAL_TABLET | Freq: Every day | ORAL | Status: DC

## 2016-03-18 MED ORDER — LOVASTATIN 40 MG PO TABS: 40.0000 mg | ORAL_TABLET | Freq: Every day | ORAL | Status: DC

## 2016-03-18 MED ORDER — FUROSEMIDE 20 MG PO TABS: 20.0000 mg | ORAL_TABLET | Freq: Every day | ORAL | Status: DC

## 2016-03-18 NOTE — Patient Instructions (Signed)
Medication Instructions:  Your physician has recommended you make the following change in your medication:  REDUCE Metoprolol to 12.5mg  daily  Your cardiac medications have been refilled ans sent to your mail order pharmacy   Labwork: Lipid and Cmet today  Testing/Procedures: None ordered  Follow-Up: Your physician recommends that you schedule a follow-up appointment in: 2 weeks with a nurse for an EKG   Your physician wants you to follow-up in: 1 year with Dr.Turner You will receive a reminder letter in the mail two months in advance. If you don't receive a letter, please call our office to schedule the follow-up appointment.    Any Other Special Instructions Will Be Listed Below (If Applicable).     If you need a refill on your cardiac medications before your next appointment, please call your pharmacy.

## 2016-04-01 ENCOUNTER — Encounter (INDEPENDENT_AMBULATORY_CARE_PROVIDER_SITE_OTHER): Payer: Self-pay

## 2016-04-01 ENCOUNTER — Ambulatory Visit (INDEPENDENT_AMBULATORY_CARE_PROVIDER_SITE_OTHER): Payer: Medicare Other | Admitting: *Deleted

## 2016-04-01 VITALS — BP 134/78 | HR 62

## 2016-04-01 DIAGNOSIS — R001 Bradycardia, unspecified: Secondary | ICD-10-CM | POA: Diagnosis not present

## 2016-04-01 NOTE — Progress Notes (Signed)
Patient recently seen by extender 03/17/16.  BB had been previously stopped secondary to bradycardia.  At 7/9 OV, BB restarted at low dose 12.5 mg BID with follow up EKG in 1 week. Patient here today for the EKG. Reports feeling well.  Denies dizziness, weakness, lightheadedness or syncope. EKG performed showing NSR w/ HR of 62. Reviewed w/ DOD, Dr. Johney Frame.  Patient instructed to monitor HR if having symptoms and report HR to office if low. Will forward to Dr. Mayford Knife for her FYI.

## 2016-04-01 NOTE — Patient Instructions (Addendum)
Medication Instructions:  Your physician recommends that you continue on your current medications as directed. Please refer to the Current Medication list given to you today.  Labwork: None ordered  Testing/Procedures: None ordered  Follow-Up: Keep scheduled follow up in 1 year with Dr.  Any Other Special Instructions Will Be Listed Below (If Applicable). Monitor your heart rate if you feel dizzy, lightheaded or weak.  If you need a refill on your cardiac medications before your next appointment, please call your pharmacy.  Thank you for choosing [facility]!!

## 2016-05-08 DIAGNOSIS — R8299 Other abnormal findings in urine: Secondary | ICD-10-CM | POA: Diagnosis not present

## 2016-05-08 DIAGNOSIS — M545 Low back pain: Secondary | ICD-10-CM | POA: Diagnosis not present

## 2016-06-14 DIAGNOSIS — Z23 Encounter for immunization: Secondary | ICD-10-CM | POA: Diagnosis not present

## 2016-06-20 DIAGNOSIS — S83281A Other tear of lateral meniscus, current injury, right knee, initial encounter: Secondary | ICD-10-CM | POA: Diagnosis not present

## 2016-06-20 DIAGNOSIS — M75111 Incomplete rotator cuff tear or rupture of right shoulder, not specified as traumatic: Secondary | ICD-10-CM | POA: Diagnosis not present

## 2016-06-28 DIAGNOSIS — M25561 Pain in right knee: Secondary | ICD-10-CM | POA: Diagnosis not present

## 2016-07-01 DIAGNOSIS — S46009A Unspecified injury of muscle(s) and tendon(s) of the rotator cuff of unspecified shoulder, initial encounter: Secondary | ICD-10-CM | POA: Diagnosis not present

## 2016-07-01 DIAGNOSIS — I10 Essential (primary) hypertension: Secondary | ICD-10-CM | POA: Diagnosis not present

## 2016-07-01 DIAGNOSIS — Z8679 Personal history of other diseases of the circulatory system: Secondary | ICD-10-CM | POA: Diagnosis not present

## 2016-07-01 DIAGNOSIS — Z01818 Encounter for other preprocedural examination: Secondary | ICD-10-CM | POA: Diagnosis not present

## 2016-07-01 DIAGNOSIS — E782 Mixed hyperlipidemia: Secondary | ICD-10-CM | POA: Diagnosis not present

## 2016-07-04 DIAGNOSIS — S46009A Unspecified injury of muscle(s) and tendon(s) of the rotator cuff of unspecified shoulder, initial encounter: Secondary | ICD-10-CM | POA: Diagnosis not present

## 2016-07-04 DIAGNOSIS — Z8679 Personal history of other diseases of the circulatory system: Secondary | ICD-10-CM | POA: Diagnosis not present

## 2016-07-04 DIAGNOSIS — I10 Essential (primary) hypertension: Secondary | ICD-10-CM | POA: Diagnosis not present

## 2016-07-04 DIAGNOSIS — E782 Mixed hyperlipidemia: Secondary | ICD-10-CM | POA: Diagnosis not present

## 2016-07-04 DIAGNOSIS — Z01818 Encounter for other preprocedural examination: Secondary | ICD-10-CM | POA: Diagnosis not present

## 2016-07-08 ENCOUNTER — Other Ambulatory Visit: Payer: Self-pay | Admitting: Family Medicine

## 2016-07-08 ENCOUNTER — Telehealth: Payer: Self-pay | Admitting: Cardiology

## 2016-07-08 DIAGNOSIS — Z1231 Encounter for screening mammogram for malignant neoplasm of breast: Secondary | ICD-10-CM

## 2016-07-08 NOTE — Telephone Encounter (Signed)
Informed [name] that clearance was faxed 10/17. Informed her of information on clearance.  Re-faxed clearance.  Jill SagoSarah was grateful for call.

## 2016-07-08 NOTE — Telephone Encounter (Signed)
New Prob  Pt has R shoulder scope rotator cuff repair planned. Calling to verify if visit from July would be suffice for cardiac clearance or if pt needs to be seen again. Please call.

## 2016-07-09 ENCOUNTER — Ambulatory Visit (HOSPITAL_BASED_OUTPATIENT_CLINIC_OR_DEPARTMENT_OTHER)
Admission: RE | Admit: 2016-07-09 | Discharge: 2016-07-09 | Disposition: A | Discharge status: Home / Self Care | Payer: Medicare Other | Source: Ambulatory Visit | Attending: Family Medicine | Admitting: Family Medicine

## 2016-07-09 DIAGNOSIS — Z1231 Encounter for screening mammogram for malignant neoplasm of breast: Secondary | ICD-10-CM | POA: Diagnosis not present

## 2016-08-09 DIAGNOSIS — M7551 Bursitis of right shoulder: Secondary | ICD-10-CM | POA: Diagnosis not present

## 2016-08-09 DIAGNOSIS — M7541 Impingement syndrome of right shoulder: Secondary | ICD-10-CM | POA: Diagnosis not present

## 2016-08-09 DIAGNOSIS — S43491A Other sprain of right shoulder joint, initial encounter: Secondary | ICD-10-CM | POA: Diagnosis not present

## 2016-08-09 DIAGNOSIS — G8918 Other acute postprocedural pain: Secondary | ICD-10-CM | POA: Diagnosis not present

## 2016-08-09 DIAGNOSIS — M24111 Other articular cartilage disorders, right shoulder: Secondary | ICD-10-CM | POA: Diagnosis not present

## 2016-08-09 DIAGNOSIS — S46211A Strain of muscle, fascia and tendon of other parts of biceps, right arm, initial encounter: Secondary | ICD-10-CM | POA: Diagnosis not present

## 2016-08-09 DIAGNOSIS — S46011A Strain of muscle(s) and tendon(s) of the rotator cuff of right shoulder, initial encounter: Secondary | ICD-10-CM | POA: Diagnosis not present

## 2016-08-14 DIAGNOSIS — M75111 Incomplete rotator cuff tear or rupture of right shoulder, not specified as traumatic: Secondary | ICD-10-CM | POA: Diagnosis not present

## 2016-08-16 DIAGNOSIS — M75111 Incomplete rotator cuff tear or rupture of right shoulder, not specified as traumatic: Secondary | ICD-10-CM | POA: Diagnosis not present

## 2016-08-20 DIAGNOSIS — M75111 Incomplete rotator cuff tear or rupture of right shoulder, not specified as traumatic: Secondary | ICD-10-CM | POA: Diagnosis not present

## 2016-08-22 DIAGNOSIS — M75111 Incomplete rotator cuff tear or rupture of right shoulder, not specified as traumatic: Secondary | ICD-10-CM | POA: Diagnosis not present

## 2016-08-27 DIAGNOSIS — M75111 Incomplete rotator cuff tear or rupture of right shoulder, not specified as traumatic: Secondary | ICD-10-CM | POA: Diagnosis not present

## 2016-08-29 DIAGNOSIS — M75111 Incomplete rotator cuff tear or rupture of right shoulder, not specified as traumatic: Secondary | ICD-10-CM | POA: Diagnosis not present

## 2016-09-04 DIAGNOSIS — M75111 Incomplete rotator cuff tear or rupture of right shoulder, not specified as traumatic: Secondary | ICD-10-CM | POA: Diagnosis not present

## 2016-09-10 DIAGNOSIS — M75111 Incomplete rotator cuff tear or rupture of right shoulder, not specified as traumatic: Secondary | ICD-10-CM | POA: Diagnosis not present

## 2016-09-12 DIAGNOSIS — M75111 Incomplete rotator cuff tear or rupture of right shoulder, not specified as traumatic: Secondary | ICD-10-CM | POA: Diagnosis not present

## 2016-09-17 DIAGNOSIS — M75111 Incomplete rotator cuff tear or rupture of right shoulder, not specified as traumatic: Secondary | ICD-10-CM | POA: Diagnosis not present

## 2016-09-19 DIAGNOSIS — M75111 Incomplete rotator cuff tear or rupture of right shoulder, not specified as traumatic: Secondary | ICD-10-CM | POA: Diagnosis not present

## 2016-09-24 DIAGNOSIS — M75111 Incomplete rotator cuff tear or rupture of right shoulder, not specified as traumatic: Secondary | ICD-10-CM | POA: Diagnosis not present

## 2016-10-02 DIAGNOSIS — M75111 Incomplete rotator cuff tear or rupture of right shoulder, not specified as traumatic: Secondary | ICD-10-CM | POA: Diagnosis not present

## 2016-10-03 DIAGNOSIS — M75111 Incomplete rotator cuff tear or rupture of right shoulder, not specified as traumatic: Secondary | ICD-10-CM | POA: Diagnosis not present

## 2016-10-07 DIAGNOSIS — M24111 Other articular cartilage disorders, right shoulder: Secondary | ICD-10-CM | POA: Diagnosis not present

## 2016-10-09 DIAGNOSIS — M75111 Incomplete rotator cuff tear or rupture of right shoulder, not specified as traumatic: Secondary | ICD-10-CM | POA: Diagnosis not present

## 2016-10-10 DIAGNOSIS — M75111 Incomplete rotator cuff tear or rupture of right shoulder, not specified as traumatic: Secondary | ICD-10-CM | POA: Diagnosis not present

## 2016-10-14 DIAGNOSIS — M75111 Incomplete rotator cuff tear or rupture of right shoulder, not specified as traumatic: Secondary | ICD-10-CM | POA: Diagnosis not present

## 2016-10-17 DIAGNOSIS — M75111 Incomplete rotator cuff tear or rupture of right shoulder, not specified as traumatic: Secondary | ICD-10-CM | POA: Diagnosis not present

## 2016-10-21 DIAGNOSIS — M75111 Incomplete rotator cuff tear or rupture of right shoulder, not specified as traumatic: Secondary | ICD-10-CM | POA: Diagnosis not present

## 2016-10-23 DIAGNOSIS — M75111 Incomplete rotator cuff tear or rupture of right shoulder, not specified as traumatic: Secondary | ICD-10-CM | POA: Diagnosis not present

## 2016-11-13 DIAGNOSIS — H40013 Open angle with borderline findings, low risk, bilateral: Secondary | ICD-10-CM | POA: Diagnosis not present

## 2016-11-13 DIAGNOSIS — H35033 Hypertensive retinopathy, bilateral: Secondary | ICD-10-CM | POA: Diagnosis not present

## 2016-11-13 DIAGNOSIS — H35373 Puckering of macula, bilateral: Secondary | ICD-10-CM | POA: Diagnosis not present

## 2016-11-13 DIAGNOSIS — Z961 Presence of intraocular lens: Secondary | ICD-10-CM | POA: Diagnosis not present

## 2016-12-02 ENCOUNTER — Telehealth: Payer: Self-pay | Admitting: Cardiology

## 2016-12-02 NOTE — Telephone Encounter (Signed)
Left message for patient to come fasting to appointment and all appropriate lab work will be collected the day of her appointment. Instructed her to call back with any questions or concerns.

## 2016-12-02 NOTE — Telephone Encounter (Signed)
Mrs. [name] is needing a order for labs to be done on the same day as her appt on 02/28/17 at 9am . Please call if you have any questions . Thanks

## 2016-12-23 DIAGNOSIS — E782 Mixed hyperlipidemia: Secondary | ICD-10-CM | POA: Diagnosis not present

## 2016-12-23 DIAGNOSIS — I10 Essential (primary) hypertension: Secondary | ICD-10-CM | POA: Diagnosis not present

## 2016-12-23 DIAGNOSIS — Z8679 Personal history of other diseases of the circulatory system: Secondary | ICD-10-CM | POA: Diagnosis not present

## 2016-12-23 DIAGNOSIS — Z0000 Encounter for general adult medical examination without abnormal findings: Secondary | ICD-10-CM | POA: Diagnosis not present

## 2016-12-26 ENCOUNTER — Encounter (INDEPENDENT_AMBULATORY_CARE_PROVIDER_SITE_OTHER): Payer: Self-pay | Admitting: Family Medicine

## 2017-01-13 DIAGNOSIS — M1711 Unilateral primary osteoarthritis, right knee: Secondary | ICD-10-CM | POA: Diagnosis not present

## 2017-01-27 ENCOUNTER — Ambulatory Visit (INDEPENDENT_AMBULATORY_CARE_PROVIDER_SITE_OTHER): Payer: Medicare Other | Admitting: Family Medicine

## 2017-01-27 ENCOUNTER — Encounter (INDEPENDENT_AMBULATORY_CARE_PROVIDER_SITE_OTHER): Payer: Self-pay | Admitting: Family Medicine

## 2017-01-27 VITALS — BP 114/72 | HR 55 | Temp 98.7°F | Resp 12 | Ht 62.0 in | Wt 202.0 lb

## 2017-01-27 DIAGNOSIS — R0602 Shortness of breath: Secondary | ICD-10-CM

## 2017-01-27 DIAGNOSIS — Z6837 Body mass index (BMI) 37.0-37.9, adult: Secondary | ICD-10-CM | POA: Diagnosis not present

## 2017-01-27 DIAGNOSIS — I519 Heart disease, unspecified: Secondary | ICD-10-CM | POA: Diagnosis not present

## 2017-01-27 DIAGNOSIS — R739 Hyperglycemia, unspecified: Secondary | ICD-10-CM | POA: Diagnosis not present

## 2017-01-27 DIAGNOSIS — E785 Hyperlipidemia, unspecified: Secondary | ICD-10-CM

## 2017-01-27 DIAGNOSIS — I5189 Other ill-defined heart diseases: Secondary | ICD-10-CM

## 2017-01-27 DIAGNOSIS — R5383 Other fatigue: Secondary | ICD-10-CM

## 2017-01-27 DIAGNOSIS — IMO0001 Reserved for inherently not codable concepts without codable children: Secondary | ICD-10-CM

## 2017-01-27 DIAGNOSIS — Z1331 Encounter for screening for depression: Secondary | ICD-10-CM

## 2017-01-27 DIAGNOSIS — E669 Obesity, unspecified: Secondary | ICD-10-CM | POA: Diagnosis not present

## 2017-01-27 DIAGNOSIS — Z0289 Encounter for other administrative examinations: Secondary | ICD-10-CM

## 2017-01-27 DIAGNOSIS — Z1389 Encounter for screening for other disorder: Secondary | ICD-10-CM | POA: Diagnosis not present

## 2017-01-27 NOTE — Progress Notes (Signed)
Office: [phone]  /  Fax: [phone]   HPI:   Chief Complaint: OBESITY  [name] (MR# [id number]) is a 76 y.o. female who presents on 01/27/2017 for obesity evaluation and treatment. Current BMI is Body mass index is 36.95 kg/m.[name] has struggled with obesity for years and has been unsuccessful in either losing weight or maintaining long term weight loss. [name] attended our information session and states she is currently in the action stage of change and ready to dedicate time achieving and maintaining a healthier weight.  [name] states her family eats meals together she thinks her family will eat healthier with  her her desired weight goal is 150 she started gaining weight when she got married in 2002 her heaviest weight ever was 212 lbs. 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   Fatigue [name] feels her energy is lower than it should be. This has worsened with weight gain and has not worsened recently. [name] admits to daytime somnolence and  denies waking up still tired. Patient is at risk for obstructive sleep apnea. Patent has a history of symptoms of daytime fatigue. Patient generally gets 7 or 8 hours of sleep per night, and states they generally have generally restful sleep. Snoring is not present. Apneic episodes is not present. Epworth Sleepiness Score is 6  Dyspnea on exertion [name] notes increasing shortness of breath with exercising and seems to be worsening over time with weight gain. She notes getting out of breath sooner with activity than she used to. This has not gotten worse recently. [name] denies orthopnea.  Hyperglycemia Patient has had more than 2 elevated fasting glucose readings. She admits to  polyphagia.   Hyperlipidemia, Unspecified hyperlipidemia [name] is on Lovastatin, she denies any chest pain.   Diastolic Dysfunction, Grade 1 Diagnosed by Echocardiogram 2016. [name] denies chest pain. She has a strong family history of cardiac issues including CHF in mother. EKG does not show LVH or left axis deviation.  Depression Screen [name]'s Food and Mood (modified PHQ-9) score was  Depression screen PHQ 2/9 01/27/2017  Decreased Interest 1  Down, Depressed, Hopeless 1  PHQ - 2 Score 2  Altered sleeping 1  Tired, decreased energy 1  Change in appetite 1  Feeling bad or failure about yourself  1  Trouble concentrating 0  Moving slowly or fidgety/restless 0  Suicidal thoughts 0  PHQ-9 Score 6    ALLERGIES: Allergies  Allergen Reactions  . Morphine And Related Nausea And Vomiting    MEDICATIONS: Current Outpatient Prescriptions on File Prior to Visit  Medication Sig Dispense Refill  . aspirin 81 MG tablet Take 81 mg by mouth daily.    . furosemide (LASIX) 20 MG tablet Take 1 tablet (20 mg total) by mouth daily. 90 tablet 3  . lisinopril (PRINIVIL,ZESTRIL) 10 MG tablet Take 1 tablet (10 mg total) by mouth daily. 90 tablet 3  . lovastatin (MEVACOR) 40 MG tablet Take 1 tablet (40 mg total) by mouth at bedtime. 90 tablet 3  . meloxicam (MOBIC) 7.5 MG tablet Take 1 tablet (7.5 mg total) by mouth daily. (Patient taking differently: Take 15 mg by mouth daily. ) 90 tablet 3  . metoprolol succinate (TOPROL-XL) 25 MG 24 hr tablet Take 0.5 tablets (12.5 mg total)  by mouth daily. 45 tablet 3  . Multiple Vitamins-Minerals (WOMENS MULTI VITAMIN & MINERAL) TABS Take 1 tablet by mouth daily.    . Omega-3 Fatty Acids (FISH OIL) 1200 MG CAPS Take 1,200 mg by mouth 2 (two) times daily. TOTAL OF 2,400 MG DAILY    . zolpidem (AMBIEN) 10 MG tablet Take 5 mg by mouth at bedtime as needed for sleep.     . [DISCONTINUED] Omeprazole (PRILOSEC PO) Take 20 mg by mouth daily.      No current  facility-administered medications on file prior to visit.     PAST MEDICAL HISTORY: Past Medical History:  Diagnosis Date  . Chronic diastolic CHF (congestive heart failure) (HCC)   . Constipation   . Diastolic dysfunction   . Gastric reflux   . GERD (gastroesophageal reflux disease)   . Hyperlipemia    dyslipidemia  . Hypertension   . Knee pain    bilateral  . Obesity   . Shoulder pain    right shoulder  . SOB (shortness of breath) on exertion   . Swelling    feet and legs    PAST SURGICAL HISTORY: Past Surgical History:  Procedure Laterality Date  . ABDOMINAL HYSTERECTOMY    . APPENDECTOMY  1977  . CARDIAC CATHETERIZATION  02/2009   no evidence of CAD/ Elevated LVEDP at the time ofcath c/w diastolic dysfunction  . KNEE SURGERY  1986  . NECK SURGERY    . OOPHORECTOMY  1972  . SHOULDER ARTHROSCOPY      SOCIAL HISTORY: Social History  Substance Use Topics  . Smoking status: Never Smoker  . Smokeless tobacco: Never Used  . Alcohol use No    FAMILY HISTORY: Family History  Problem Relation Age of Onset  . Heart failure Mother   . Diabetes Mother   . Hypertension Mother   . Hyperlipidemia Mother   . Stroke Mother   . Heart disease Mother   . CAD Father   . Heart attack Father   . Hypertension Father   . Hyperlipidemia Father   . Heart disease Father   . Sudden death Father   . CAD Sister   . CAD Brother   . Pancreatic cancer Brother   . CAD Brother   . CAD Brother   . Heart failure Sister   . Other Brother        OPEN HEART  . Other Sister        OPEN HEART    ROS: Review of Systems  Constitutional: Positive for malaise/fatigue.  Respiratory: Negative for shortness of breath.   Cardiovascular: Negative for chest pain, palpitations, orthopnea and claudication.  Gastrointestinal: Negative for nausea and vomiting.  Musculoskeletal: Negative for myalgias.       Negative for muscle weakness.  Endo/Heme/Allergies:       Positive for polyphagia.     PHYSICAL EXAM: Blood pressure 114/72, pulse (!) 55, temperature 98.7 F (37.1 C), temperature source Oral, resp. rate 12, height 5\' 2"  (1.575 m), weight 202 lb (91.6 kg), SpO2 97 %. Body mass index is 36.95 kg/m. Physical Exam  Constitutional: She is oriented to person, place, and time. She appears well-developed and well-nourished.  Cardiovascular: Normal rate.   Pulmonary/Chest: Effort normal.  Musculoskeletal: She exhibits no edema.  Neurological: She is oriented to person, place, and time.  Skin: Skin is warm and dry.  Psychiatric: She has a normal mood and affect.    RECENT LABS AND TESTS: BMET    Component Value  Date/Time   NA 141 03/18/2016 1000   K 4.7 03/18/2016 1000   CL 105 03/18/2016 1000   CO2 29 03/18/2016 1000   GLUCOSE 104 (H) 03/18/2016 1000   BUN 24 03/18/2016 1000   CREATININE 0.70 03/18/2016 1000   CALCIUM 9.2 03/18/2016 1000   GFRNONAA >60 04/22/2011 1439   GFRAA >60 04/22/2011 1439   No results found for: HGBA1C No results found for: INSULIN CBC    Component Value Date/Time   WBC 10.0 04/22/2011 1439   RBC 4.08 04/22/2011 1439   HGB 13.0 04/22/2011 1439   HCT 37.6 04/22/2011 1439   PLT 206 04/22/2011 1439   MCV 92.2 04/22/2011 1439   MCH 31.9 04/22/2011 1439   MCHC 34.6 04/22/2011 1439   RDW 12.5 04/22/2011 1439   LYMPHSABS 2.3 04/22/2011 1439   MONOABS 0.5 04/22/2011 1439   EOSABS 0.1 04/22/2011 1439   BASOSABS 0.0 04/22/2011 1439   Iron/TIBC/Ferritin/ %Sat No results found for: IRON, TIBC, FERRITIN, IRONPCTSAT Lipid Panel     Component Value Date/Time   CHOL 212 (H) 03/18/2016 1000   TRIG 126 03/18/2016 1000   HDL 47 03/18/2016 1000   CHOLHDL 4.5 03/18/2016 1000   VLDL 25 03/18/2016 1000   LDLCALC 140 (H) 03/18/2016 1000   Hepatic Function Panel     Component Value Date/Time   PROT 6.6 03/18/2016 1000   ALBUMIN 4.3 03/18/2016 1000   AST 32 03/18/2016 1000   ALT 50 (H) 03/18/2016 1000   ALKPHOS 66 03/18/2016 1000    BILITOT 0.5 03/18/2016 1000   BILIDIR 0.1 02/07/2015 1521   No results found for: TSH  ECG  shows NSR with a rate of 58 INDIRECT CALORIMETER done today shows a VO2 of 228 and a REE of 1585.    ASSESSMENT AND PLAN: Other fatigue - Plan: EKG 12-Lead, Comprehensive metabolic panel, CBC with Differential/Platelet, VITAMIN D 25 Hydroxy (Vit-D Deficiency, Fractures), TSH, T4, free, T3  SOB (shortness of breath) on exertion  Hyperglycemia - Plan: Hemoglobin A1c, Insulin, random  Hyperlipidemia, unspecified hyperlipidemia type - Plan: Lipid Panel With LDL/HDL Ratio  Diastolic dysfunction - Grade 1  Depression screening  Class 2 obesity with serious comorbidity and body mass index (BMI) of 37.0 to 37.9 in adult, unspecified obesity type     PLAN:  Fatigue Myrella was informed that her fatigue may be related to obesity, depression or many other causes. Labs will be ordered, and in the meanwhile Shawntelle has agreed to work on diet, exercise and weight loss to help with fatigue. Proper sleep hygiene was discussed including the need for 7-8 hours of quality sleep each night. A sleep study was not ordered based on symptoms and Epworth score.  Dyspnea on exertion Byrd's shortness of breath appears to be obesity related and exercise induced. She has agreed to work on weight loss and gradually increase exercise to treat her exercise induced shortness of breath. If Kima follows our instructions and loses weight without improvement of her shortness of breath, we will plan to refer to pulmonology. We will monitor this condition regularly. Yaeko agrees to this plan.  Hyperglycemia Will continue to follow and will recheck Hgb A1C, Insulin, and random glucose  Hyperlipidemia, Unspecified hyperlipidemia Continue Lovastatin, will recheck lipid panel with LDL/HDL ratio  Diastolic Dysfunction, Grade 1 Giulianna denies any symptoms of CHF. Will work on diet and weight loss.  Depression Screen Anorah had a  mildly positive depression screening. Depression is commonly associated with obesity and often results  in emotional eating behaviors. We will monitor this closely and work on CBT to help improve the non-hunger eating patterns. Referral to Psychology may be required if no improvement is seen as she continues in our clinic.  Obesity Greidy is currently in the action stage of change and her goal is to continue with weight loss efforts She has agreed to follow the Category 2 plan Shandelle has been instructed to work up to a goal of 150 minutes of combined cardio and strengthening exercise per week for weight loss and overall health benefits. We discussed the following Behavioral Modification Stratagies today: increasing lean protein intake and work on meal planning and easy cooking plans  Cinderella has agreed to follow up with our clinic in 2 weeks. She was informed of the importance of frequent follow up visits to maximize her success with intensive lifestyle modifications for her multiple health conditions. She was informed we would discuss her lab results at her next visit unless there is a critical issue that needs to be addressed sooner. Jaszmine agreed to keep her next visit at the agreed upon time to discuss these results.  I, [place] Crane, am acting as scribe for Quillian Quince, MD  I have reviewed the above documentation for accuracy and completeness, and I agree with the above. -Quillian Quince, MD

## 2017-01-28 LAB — COMPREHENSIVE METABOLIC PANEL
ALBUMIN: 4.8 g/dL (ref 3.5–4.8)
ALK PHOS: 100 IU/L (ref 39–117)
ALT: 56 IU/L — ABNORMAL HIGH (ref 0–32)
AST: 45 IU/L — AB (ref 0–40)
Albumin/Globulin Ratio: 2.1 (ref 1.2–2.2)
BILIRUBIN TOTAL: 0.6 mg/dL (ref 0.0–1.2)
BUN/Creatinine Ratio: 44 — ABNORMAL HIGH (ref 12–28)
BUN: 29 mg/dL — ABNORMAL HIGH (ref 8–27)
CHLORIDE: 98 mmol/L (ref 96–106)
CO2: 24 mmol/L (ref 18–29)
Calcium: 9.6 mg/dL (ref 8.7–10.3)
Creatinine, Ser: 0.66 mg/dL (ref 0.57–1.00)
GFR calc Af Amer: 101 mL/min/{1.73_m2} (ref >59)
GFR calc non Af Amer: 88 mL/min/{1.73_m2} (ref >59)
GLOBULIN, TOTAL: 2.3 g/dL (ref 1.5–4.5)
Glucose: 95 mg/dL (ref 65–99)
POTASSIUM: 4.6 mmol/L (ref 3.5–5.2)
SODIUM: 138 mmol/L (ref 134–144)
Total Protein: 7.1 g/dL (ref 6.0–8.5)

## 2017-01-28 LAB — CBC WITH DIFFERENTIAL/PLATELET
BASOS ABS: 0 10*3/uL (ref 0.0–0.2)
Basos: 0 %
EOS (ABSOLUTE): 0.1 10*3/uL (ref 0.0–0.4)
Eos: 2 %
HEMATOCRIT: 41.1 % (ref 34.0–46.6)
Hemoglobin: 13.4 g/dL (ref 11.1–15.9)
IMMATURE GRANULOCYTES: 0 %
Immature Grans (Abs): 0 10*3/uL (ref 0.0–0.1)
LYMPHS ABS: 2.1 10*3/uL (ref 0.7–3.1)
Lymphs: 28 %
MCH: 31.1 pg (ref 26.6–33.0)
MCHC: 32.6 g/dL (ref 31.5–35.7)
MCV: 95 fL (ref 79–97)
Monocytes Absolute: 0.3 10*3/uL (ref 0.1–0.9)
Monocytes: 4 %
NEUTROS PCT: 66 %
Neutrophils Absolute: 4.8 10*3/uL (ref 1.4–7.0)
PLATELETS: 231 10*3/uL (ref 150–379)
RBC: 4.31 x10E6/uL (ref 3.77–5.28)
RDW: 13.3 % (ref 12.3–15.4)
WBC: 7.3 10*3/uL (ref 3.4–10.8)

## 2017-01-28 LAB — T4, FREE: Free T4: 1.01 ng/dL (ref 0.82–1.77)

## 2017-01-28 LAB — LIPID PANEL WITH LDL/HDL RATIO
Cholesterol, Total: 174 mg/dL (ref 100–199)
HDL: 46 mg/dL (ref >39)
LDL Calculated: 107 mg/dL — ABNORMAL HIGH (ref 0–99)
LDL/HDL RATIO: 2.3 ratio (ref 0.0–3.2)
Triglycerides: 105 mg/dL (ref 0–149)
VLDL Cholesterol Cal: 21 mg/dL (ref 5–40)

## 2017-01-28 LAB — HEMOGLOBIN A1C
ESTIMATED AVERAGE GLUCOSE: 117 mg/dL
HEMOGLOBIN A1C: 5.7 % — AB (ref 4.8–5.6)

## 2017-01-28 LAB — T3: T3, Total: 98 ng/dL (ref 71–180)

## 2017-01-28 LAB — TSH: TSH: 1.71 u[IU]/mL (ref 0.450–4.500)

## 2017-01-28 LAB — INSULIN, RANDOM: INSULIN: 16.7 u[IU]/mL (ref 2.6–24.9)

## 2017-01-28 LAB — VITAMIN D 25 HYDROXY (VIT D DEFICIENCY, FRACTURES): VIT D 25 HYDROXY: 40.2 ng/mL (ref 30.0–100.0)

## 2017-02-10 ENCOUNTER — Ambulatory Visit (INDEPENDENT_AMBULATORY_CARE_PROVIDER_SITE_OTHER): Payer: Medicare Other | Admitting: Family Medicine

## 2017-02-10 VITALS — BP 125/74 | HR 61 | Temp 98.1°F | Ht 62.0 in | Wt 197.0 lb

## 2017-02-10 DIAGNOSIS — E559 Vitamin D deficiency, unspecified: Secondary | ICD-10-CM

## 2017-02-10 DIAGNOSIS — Z6836 Body mass index (BMI) 36.0-36.9, adult: Secondary | ICD-10-CM

## 2017-02-10 DIAGNOSIS — E860 Dehydration: Secondary | ICD-10-CM

## 2017-02-10 DIAGNOSIS — R7303 Prediabetes: Secondary | ICD-10-CM | POA: Diagnosis not present

## 2017-02-10 DIAGNOSIS — E669 Obesity, unspecified: Secondary | ICD-10-CM | POA: Diagnosis not present

## 2017-02-10 DIAGNOSIS — R748 Abnormal levels of other serum enzymes: Secondary | ICD-10-CM | POA: Diagnosis not present

## 2017-02-10 MED ORDER — VITAMIN D (ERGOCALCIFEROL) 1.25 MG (50000 UNIT) PO CAPS: 50000.0000 [IU] | ORAL_CAPSULE | ORAL | 0 refills | Status: DC

## 2017-02-11 NOTE — Progress Notes (Signed)
Office: [phone]  /  Fax: [phone]   HPI:   Chief Complaint: OBESITY [name] is here to discuss her progress with her obesity treatment plan. She is on the  follow the Category 2 plan and is following her eating plan approximately 95 % of the time. She states she is exercising 0 minutes 0 times per week. [name] has done well with weight loss on category 2 plan. She did well even when on vacation, had lots of family sabotage. [name] did well with planning meals ahead of time. Her weight is 197 lb (89.4 kg) today and has had a weight loss of 5 pounds over a period of 2 weeks since her last visit. She has lost 5 lbs since starting treatment with [place].  Vitamin D deficiency [name] has a new diagnosis of vitamin D deficiency. She is currently on multi-vitamin, almost at goal and denies nausea, vomiting or muscle weakness.  Pre-Diabetes [name] has a new diagnosis of pre-diabetes based on her slightly elevated Hgb A1c at 5.7 and insulin >5 and was informed this puts her at greater risk of developing diabetes. [name] has a strong family history of diabetes with multiple family members. She is not taking metformin currently and continues to work on diet and exercise to decrease risk of diabetes. She denies nausea or hypoglycemia.  Elevated Liver Function Test [name] has a new diagnosis of elevated liver function test, slightly higher than last year but still only mildly elevated. She denies abdominal pain or jaundice. We discussed the likely diagnosis of non alcoholic fatty liver disease today and how this condition is obesity related. [name] was educated on her risk of developing NASH or even liver failure and the only proven treatment for NAFLD was weight loss. [name] agreed to continue with her weight loss efforts with healthier diet and exercise as an essential part of her treatment plan.  Dehydration [name] has increased H2O to > 64 oz per day and states her urine is now almost clear but creatinine is  within normal limits.  ALLERGIES: Allergies  Allergen Reactions  . Morphine And Related Nausea And Vomiting    MEDICATIONS: Current Outpatient Prescriptions on File Prior to Visit  Medication Sig Dispense Refill  . aspirin 81 MG tablet Take 81 mg by mouth daily.    . furosemide (LASIX) 20 MG tablet Take 1 tablet (20 mg total) by mouth daily. 90 tablet 3  . lisinopril (PRINIVIL,ZESTRIL) 10 MG tablet Take 1 tablet (10 mg total) by mouth daily. 90 tablet 3  . lovastatin (MEVACOR) 40 MG tablet Take 1 tablet (40 mg total) by mouth at bedtime. 90 tablet 3  . meloxicam (MOBIC) 7.5 MG tablet Take 1 tablet (7.5 mg total) by mouth daily. (Patient taking differently: Take 15 mg by mouth daily. ) 90 tablet 3  . metoprolol succinate (TOPROL-XL) 25 MG 24 hr tablet Take 0.5 tablets (12.5 mg total) by mouth daily. 45 tablet 3  . Multiple Vitamins-Minerals (WOMENS MULTI VITAMIN & MINERAL) TABS Take 1 tablet by mouth daily.    . Omega-3 Fatty Acids (FISH OIL) 1200 MG CAPS Take 1,200 mg by mouth 2 (two) times daily. TOTAL OF 2,400 MG DAILY    . omeprazole (PRILOSEC) 20 MG capsule Take 20 mg by mouth daily.    Marland Kitchen zolpidem (AMBIEN) 10 MG tablet Take 5 mg by mouth at bedtime as needed for sleep.     . [DISCONTINUED] Omeprazole (PRILOSEC PO) Take 20 mg by mouth daily.  No current facility-administered medications on file prior to visit.     PAST MEDICAL HISTORY: Past Medical History:  Diagnosis Date  . Chronic diastolic CHF (congestive heart failure) (HCC)   . Constipation   . Diastolic dysfunction   . Gastric reflux   . GERD (gastroesophageal reflux disease)   . Hyperlipemia    dyslipidemia  . Hypertension   . Knee pain    bilateral  . Obesity   . Shoulder pain    right shoulder  . SOB (shortness of breath) on exertion   . Swelling    feet and legs    PAST SURGICAL HISTORY: Past Surgical History:  Procedure Laterality Date  . ABDOMINAL HYSTERECTOMY    . APPENDECTOMY  1977  . CARDIAC  CATHETERIZATION  02/2009   no evidence of CAD/ Elevated LVEDP at the time ofcath c/w diastolic dysfunction  . KNEE SURGERY  1986  . NECK SURGERY    . OOPHORECTOMY  1972  . SHOULDER ARTHROSCOPY      SOCIAL HISTORY: Social History  Substance Use Topics  . Smoking status: Never Smoker  . Smokeless tobacco: Never Used  . Alcohol use No    FAMILY HISTORY: Family History  Problem Relation Age of Onset  . Heart failure Mother   . Diabetes Mother   . Hypertension Mother   . Hyperlipidemia Mother   . Stroke Mother   . Heart disease Mother   . CAD Father   . Heart attack Father   . Hypertension Father   . Hyperlipidemia Father   . Heart disease Father   . Sudden death Father   . CAD Sister   . CAD Brother   . Pancreatic cancer Brother   . CAD Brother   . CAD Brother   . Heart failure Sister   . Other Brother        OPEN HEART  . Other Sister        OPEN HEART    ROS: Review of Systems  Constitutional: Positive for weight loss.  Gastrointestinal: Negative for abdominal pain, nausea and vomiting.       Negative jaundice  Musculoskeletal:       Negative muscle weakness  Endo/Heme/Allergies:       Negative hypoglycemia    PHYSICAL EXAM: Blood pressure 125/74, pulse 61, temperature 98.1 F (36.7 C), temperature source Oral, height 5\' 2"  (1.575 m), weight 197 lb (89.4 kg), SpO2 96 %. Body mass index is 36.03 kg/m. Physical Exam  Constitutional: She is oriented to person, place, and time. She appears well-developed and well-nourished.  Cardiovascular: Normal rate.   Pulmonary/Chest: Effort normal.  Musculoskeletal: Normal range of motion.  Neurological: She is oriented to person, place, and time.  Skin: Skin is warm and dry.  Psychiatric: She has a normal mood and affect. Her behavior is normal.  Vitals reviewed.   RECENT LABS AND TESTS: BMET    Component Value Date/Time   NA 138 01/27/2017 1054   K 4.6 01/27/2017 1054   CL 98 01/27/2017 1054   CO2 24  01/27/2017 1054   GLUCOSE 95 01/27/2017 1054   GLUCOSE 104 (H) 03/18/2016 1000   BUN 29 (H) 01/27/2017 1054   CREATININE 0.66 01/27/2017 1054   CREATININE 0.70 03/18/2016 1000   CALCIUM 9.6 01/27/2017 1054   GFRNONAA 88 01/27/2017 1054   GFRAA 101 01/27/2017 1054   Lab Results  Component Value Date   HGBA1C 5.7 (H) 01/27/2017   Lab Results  Component Value Date  INSULIN 16.7 01/27/2017   CBC    Component Value Date/Time   WBC 7.3 01/27/2017 1040   WBC 10.0 04/22/2011 1439   RBC 4.31 01/27/2017 1040   RBC 4.08 04/22/2011 1439   HGB 13.0 04/22/2011 1439   HCT 41.1 01/27/2017 1040   PLT 231 01/27/2017 1040   MCV 95 01/27/2017 1040   MCH 31.1 01/27/2017 1040   MCH 31.9 04/22/2011 1439   MCHC 32.6 01/27/2017 1040   MCHC 34.6 04/22/2011 1439   RDW 13.3 01/27/2017 1040   LYMPHSABS 2.1 01/27/2017 1040   MONOABS 0.5 04/22/2011 1439   EOSABS 0.1 01/27/2017 1040   BASOSABS 0.0 01/27/2017 1040   Iron/TIBC/Ferritin/ %Sat No results found for: IRON, TIBC, FERRITIN, IRONPCTSAT Lipid Panel     Component Value Date/Time   CHOL 174 01/27/2017 1040   TRIG 105 01/27/2017 1040   HDL 46 01/27/2017 1040   CHOLHDL 4.5 03/18/2016 1000   VLDL 25 03/18/2016 1000   LDLCALC 107 (H) 01/27/2017 1040   Hepatic Function Panel     Component Value Date/Time   PROT 7.1 01/27/2017 1054   ALBUMIN 4.8 01/27/2017 1054   AST 45 (H) 01/27/2017 1054   ALT 56 (H) 01/27/2017 1054   ALKPHOS 100 01/27/2017 1054   BILITOT 0.6 01/27/2017 1054   BILIDIR 0.1 02/07/2015 1521      Component Value Date/Time   TSH 1.710 01/27/2017 1040    ASSESSMENT AND PLAN: Vitamin D deficiency - Plan: Vitamin D, Ergocalciferol, (DRISDOL) 50000 units CAPS capsule  Increased liver enzymes  Dehydration  Prediabetes  Class 2 obesity without serious comorbidity with body mass index (BMI) of 36.0 to 36.9 in adult, unspecified obesity type  PLAN:  Vitamin D Deficiency [name] was informed that low vitamin D  levels contributes to fatigue and are associated with obesity, breast, and colon cancer. She agrees to start to take prescription Vit D @50 ,000 IU every week #4 with no refills and continue to take multi-vitamin and will follow up for routine testing of vitamin D, at least 2-3 times per year. She was informed of the risk of over-replacement of vitamin D and agrees to not increase her dose unless he discusses this with [place] first. [name] agrees to follow up with our clinic in 2 weeks.  Pre-Diabetes [name] will continue to work on weight loss, exercise, and decreasing simple carbohydrates in her diet to help decrease the risk of diabetes. We dicussed metformin including benefits and risks. She was informed that eating too many simple carbohydrates or too many calories at one sitting increases the likelihood of GI side effects. Ouida declined metformin for now and a prescription was not written today. We will re-check labs in 3 months and [name] agreed to follow up with [place] as directed to monitor her progress.  Elevated Liver Function Test We discussed the likely diagnosis of non alcoholic fatty liver disease today and how this condition is obesity related. [name] was educated on her risk of developing NASH or even liver failure and the only proven treatment for NAFLD was weight loss. [name] agreed to continue with her weight loss efforts with healthier diet and exercise as an essential part of her treatment plan. We will re-check labs in 3 months. [name] may need further evaluation if no improvement.  Dehydration [name] agrees to continue to increase H2O > 64 oz per day and we will re-check labs in 3 months. [name] will follow up with our clinic in 2 weeks.  Obesity [name] is currently  in the action stage of change. As such, her goal is to continue with weight loss efforts She has agreed to follow the Category 2 plan [name] has been instructed to work up to a goal of 150 minutes of combined cardio and strengthening  exercise per week for weight loss and overall health benefits. We discussed the following Behavioral Modification Strategies today: increase H2O intake, dealing with family or coworker sabotage, increasing lean protein intake and decreasing simple carbohydrates     [name] has agreed to follow up with our clinic in 2 weeks. She was informed of the importance of frequent follow up visits to maximize her success with intensive lifestyle modifications for her multiple health conditions.  Jill LoronI, [name], am acting as scribe for Jill Quincearen Milia Warth, MD  OBESITY BEHAVIORAL INTERVENTION VISIT  Today's visit was # 2 out of 22.  Starting weight: 202 lbs Starting date: 01/27/17 Today's weight : 197 lbs Today's date: 02/10/2017 Total lbs lost to date: 5 (Patients must lose 7 lbs in the first 6 months to continue with counseling)   ASK: We discussed the diagnosis of obesity with Jill JarvisLinda S Rosario today and [name] agreed to give us permission to discuss obesity behavioral modification therapy today.  ASSESS: [name] has the diagnosis of obesity and her BMI today is 36.1 [name] is in the action stage of change   ADVISE: [name] was educated on the multiple health risks of obesity as well as the benefit of weight loss to improve her health. She was advised of the need for long term treatment and the importance of lifestyle modifications.  AGREE: Multiple dietary modification options and treatment options were discussed and  [name] agreed to follow the Category 2 plan We discussed the following Behavioral Modification Strategies today: increase H2O intake, dealing with family or coworker sabotage, increasing lean protein intake and decreasing simple carbohydrates     I have reviewed the above documentation for accuracy and completeness, and I agree with the above. -Jill Quincearen Charlena Haub, MD

## 2017-02-26 ENCOUNTER — Ambulatory Visit (INDEPENDENT_AMBULATORY_CARE_PROVIDER_SITE_OTHER): Payer: Medicare Other | Admitting: Family Medicine

## 2017-02-26 VITALS — BP 124/74 | HR 62 | Temp 98.3°F | Ht 62.0 in | Wt 191.0 lb

## 2017-02-26 DIAGNOSIS — R7303 Prediabetes: Secondary | ICD-10-CM

## 2017-02-26 DIAGNOSIS — Z6835 Body mass index (BMI) 35.0-35.9, adult: Secondary | ICD-10-CM

## 2017-02-26 DIAGNOSIS — E669 Obesity, unspecified: Secondary | ICD-10-CM | POA: Diagnosis not present

## 2017-02-26 DIAGNOSIS — Z9189 Other specified personal risk factors, not elsewhere classified: Secondary | ICD-10-CM

## 2017-02-26 DIAGNOSIS — E559 Vitamin D deficiency, unspecified: Secondary | ICD-10-CM

## 2017-02-26 MED ORDER — VITAMIN D (ERGOCALCIFEROL) 1.25 MG (50000 UNIT) PO CAPS: 50000.0000 [IU] | ORAL_CAPSULE | ORAL | 0 refills | Status: DC

## 2017-02-26 NOTE — Progress Notes (Signed)
Office: [phone]  /  Fax: [phone]   HPI:   Chief Complaint: OBESITY [name] is here to discuss her progress with her obesity treatment plan. She is on the  follow the Category 2 plan and is following her eating plan approximately 100 % of the time. 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 [name] continues to do well with weight loss. She is planning ahead well. Her weight is 191 lb (86.6 kg) today and has had a weight loss of 6 pounds over a period of 2 weeks since her last visit. She has lost 11 lbs since starting treatment with [place].  Vitamin D deficiency [name] has a diagnosis of vitamin D deficiency. She is currently taking vit D and denies nausea, vomiting or muscle weakness.  Pre-Diabetes [name] has a diagnosis of pre-diabetes based on her elevated Hgb A1c and was informed this puts her at greater risk of developing diabetes. She is not taking metformin currently and continues to work on diet and exercise to decrease risk of diabetes. She denies nausea, polyphagia or hypoglycemia.  At risk for diabetes [name] is at higher than average risk for developing diabetes due to her obesity and pre-diabetes. She currently denies polyuria or polydipsia.  ALLERGIES: Allergies  Allergen Reactions  . Morphine And Related Nausea And Vomiting    MEDICATIONS: Current Outpatient Prescriptions on File Prior to Visit  Medication Sig Dispense Refill  . aspirin 81 MG tablet Take 81 mg by mouth daily.    . furosemide (LASIX) 20 MG tablet Take 1 tablet (20 mg total) by mouth daily. 90 tablet 3  . lisinopril (PRINIVIL,ZESTRIL) 10 MG tablet Take 1 tablet (10 mg total) by mouth daily. 90 tablet 3  . lovastatin (MEVACOR) 40 MG tablet Take 1 tablet (40 mg total) by mouth at bedtime. 90 tablet 3  . meloxicam (MOBIC) 7.5 MG tablet Take 1 tablet (7.5 mg total) by mouth daily. (Patient taking differently: Take 15 mg by mouth daily. ) 90 tablet 3  . metoprolol succinate  (TOPROL-XL) 25 MG 24 hr tablet Take 0.5 tablets (12.5 mg total) by mouth daily. 45 tablet 3  . Multiple Vitamins-Minerals (WOMENS MULTI VITAMIN & MINERAL) TABS Take 1 tablet by mouth daily.    . Omega-3 Fatty Acids (FISH OIL) 1200 MG CAPS Take 1,200 mg by mouth 2 (two) times daily. TOTAL OF 2,400 MG DAILY    . omeprazole (PRILOSEC) 20 MG capsule Take 20 mg by mouth daily.    Marland Kitchen zolpidem (AMBIEN) 10 MG tablet Take 5 mg by mouth at bedtime as needed for sleep.     . [DISCONTINUED] Omeprazole (PRILOSEC PO) Take 20 mg by mouth daily.      No current facility-administered medications on file prior to visit.     PAST MEDICAL HISTORY: Past Medical History:  Diagnosis Date  . Chronic diastolic CHF (congestive heart failure) (HCC)   . Constipation   . Diastolic dysfunction   . Gastric reflux   . GERD (gastroesophageal reflux disease)   . Hyperlipemia    dyslipidemia  . Hypertension   . Knee pain    bilateral  . Obesity   . Shoulder pain    right shoulder  . SOB (shortness of breath) on exertion   . Swelling    feet and legs    PAST SURGICAL HISTORY: Past Surgical History:  Procedure Laterality Date  . ABDOMINAL HYSTERECTOMY    . APPENDECTOMY  1977  . CARDIAC CATHETERIZATION  02/2009  no evidence of CAD/ Elevated LVEDP at the time ofcath c/w diastolic dysfunction  . KNEE SURGERY  1986  . NECK SURGERY    . OOPHORECTOMY  1972  . SHOULDER ARTHROSCOPY      SOCIAL HISTORY: Social History  Substance Use Topics  . Smoking status: Never Smoker  . Smokeless tobacco: Never Used  . Alcohol use No    FAMILY HISTORY: Family History  Problem Relation Age of Onset  . Heart failure Mother   . Diabetes Mother   . Hypertension Mother   . Hyperlipidemia Mother   . Stroke Mother   . Heart disease Mother   . CAD Father   . Heart attack Father   . Hypertension Father   . Hyperlipidemia Father   . Heart disease Father   . Sudden death Father   . CAD Sister   . CAD Brother   .  Pancreatic cancer Brother   . CAD Brother   . CAD Brother   . Heart failure Sister   . Other Brother        OPEN HEART  . Other Sister        OPEN HEART    ROS: Review of Systems  Constitutional: Positive for weight loss.  Gastrointestinal: Negative for nausea and vomiting.  Genitourinary: Negative for frequency.  Musculoskeletal:       Negative muscle weakness  Endo/Heme/Allergies: Negative for polydipsia.       Negative polyphagia Negative hypoglycemia    PHYSICAL EXAM: Blood pressure 124/74, pulse 62, temperature 98.3 F (36.8 C), temperature source Oral, height 5\' 2"  (1.575 m), weight 191 lb (86.6 kg), SpO2 96 %. Body mass index is 34.93 kg/m. Physical Exam  Constitutional: She is oriented to person, place, and time. She appears well-developed and well-nourished.  Cardiovascular: Normal rate.   Pulmonary/Chest: Effort normal.  Musculoskeletal: Normal range of motion.  Neurological: She is oriented to person, place, and time.  Skin: Skin is warm and dry.  Psychiatric: She has a normal mood and affect. Her behavior is normal.  Vitals reviewed.   RECENT LABS AND TESTS: BMET    Component Value Date/Time   NA 138 01/27/2017 1054   K 4.6 01/27/2017 1054   CL 98 01/27/2017 1054   CO2 24 01/27/2017 1054   GLUCOSE 95 01/27/2017 1054   GLUCOSE 104 (H) 03/18/2016 1000   BUN 29 (H) 01/27/2017 1054   CREATININE 0.66 01/27/2017 1054   CREATININE 0.70 03/18/2016 1000   CALCIUM 9.6 01/27/2017 1054   GFRNONAA 88 01/27/2017 1054   GFRAA 101 01/27/2017 1054   Lab Results  Component Value Date   HGBA1C 5.7 (H) 01/27/2017   Lab Results  Component Value Date   INSULIN 16.7 01/27/2017   CBC    Component Value Date/Time   WBC 7.3 01/27/2017 1040   WBC 10.0 04/22/2011 1439   RBC 4.31 01/27/2017 1040   RBC 4.08 04/22/2011 1439   HGB 13.4 01/27/2017 1040   HCT 41.1 01/27/2017 1040   PLT 231 01/27/2017 1040   MCV 95 01/27/2017 1040   MCH 31.1 01/27/2017 1040   MCH  31.9 04/22/2011 1439   MCHC 32.6 01/27/2017 1040   MCHC 34.6 04/22/2011 1439   RDW 13.3 01/27/2017 1040   LYMPHSABS 2.1 01/27/2017 1040   MONOABS 0.5 04/22/2011 1439   EOSABS 0.1 01/27/2017 1040   BASOSABS 0.0 01/27/2017 1040   Iron/TIBC/Ferritin/ %Sat No results found for: IRON, TIBC, FERRITIN, IRONPCTSAT Lipid Panel     Component Value  Date/Time   CHOL 174 01/27/2017 1040   TRIG 105 01/27/2017 1040   HDL 46 01/27/2017 1040   CHOLHDL 4.5 03/18/2016 1000   VLDL 25 03/18/2016 1000   LDLCALC 107 (H) 01/27/2017 1040   Hepatic Function Panel     Component Value Date/Time   PROT 7.1 01/27/2017 1054   ALBUMIN 4.8 01/27/2017 1054   AST 45 (H) 01/27/2017 1054   ALT 56 (H) 01/27/2017 1054   ALKPHOS 100 01/27/2017 1054   BILITOT 0.6 01/27/2017 1054   BILIDIR 0.1 02/07/2015 1521      Component Value Date/Time   TSH 1.710 01/27/2017 1040    ASSESSMENT AND PLAN: Vitamin D deficiency - Plan: Vitamin D, Ergocalciferol, (DRISDOL) 50000 units CAPS capsule  Prediabetes  At risk for diabetes mellitus  Class 2 obesity without serious comorbidity with body mass index (BMI) of 35.0 to 35.9 in adult, unspecified obesity type  PLAN:  Vitamin D Deficiency [name] was informed that low vitamin D levels contributes to fatigue and are associated with obesity, breast, and colon cancer. She agrees to continue to take prescription Vit D @50 ,000 IU every week, we will refill for 1 month and will follow up for routine testing of vitamin D, at least 2-3 times per year. She was informed of the risk of over-replacement of vitamin D and agrees to not increase her dose unless he discusses this with us first. [name] agrees to follow up with our clinic in 2 to 3 weeks.  Pre-Diabetes [name] will continue to work on weight loss, exercise, and decreasing simple carbohydrates in her diet to help decrease the risk of diabetes. We dicussed metformin including benefits and risks. She was informed that eating too  many simple carbohydrates or too many calories at one sitting increases the likelihood of GI side effects. [name] declined metformin for now and a prescription was not written today. [name] agreed to follow up with us as directed to monitor her progress.  Diabetes risk counselling [name] was given extended (at least 15 minutes) diabetes prevention counseling today. She is 76 y.o. female and has risk factors for diabetes including obesity and pre-diabetes. We discussed intensive lifestyle modifications today with an emphasis on weight loss as well as increasing exercise and decreasing simple carbohydrates in her diet.  Obesity [name] is currently in the action stage of change. As such, her goal is to continue with weight loss efforts She has agreed to follow the Category 2 plan [name] has been instructed to work up to a goal of 150 minutes of combined cardio and strengthening exercise per week for weight loss and overall health benefits. We discussed the following Behavioral Modification Strategies today: increasing lean protein intake and increase H2O intake  [name] has agreed to follow up with our clinic in 2 to 3 weeks. She was informed of the importance of frequent follow up visits to maximize her success with intensive lifestyle modifications for her multiple health conditions.  I, [place] CraneJoanne Murray, am acting as transcriptionist for Quillian Quincearen Beasley, MD  I have reviewed the above documentation for accuracy and completeness, and I agree with the above. -Quillian Quincearen Beasley, MD   OBESITY BEHAVIORAL INTERVENTION VISIT  Today's visit was # 3 out of 22.  Starting weight: 202 lbs Starting date: 01/27/17 Today's weight : 191 lbs Today's date: 02/26/2017 Total lbs lost to date: 11 (Patients must lose 7 lbs in the first 6 months to continue with counseling)   ASK: We discussed the diagnosis of obesity with Pilar JarvisLinda S  Rosario today and Jessenya agreed to give [place] permission to discuss obesity behavioral  modification therapy today.  ASSESS: Faiza has the diagnosis of obesity and her BMI today is 22 Danity is in the action stage of change   ADVISE: Carliss was educated on the multiple health risks of obesity as well as the benefit of weight loss to improve her health. She was advised of the need for long term treatment and the importance of lifestyle modifications.  AGREE: Multiple dietary modification options and treatment options were discussed and  Jamariah agreed to follow the Category 2 plan We discussed the following Behavioral Modification Strategies today: increasing lean protein intake and increase H2O intake

## 2017-02-28 ENCOUNTER — Ambulatory Visit (INDEPENDENT_AMBULATORY_CARE_PROVIDER_SITE_OTHER): Payer: Medicare Other | Admitting: Cardiology

## 2017-02-28 ENCOUNTER — Encounter: Payer: Self-pay | Admitting: Cardiology

## 2017-02-28 VITALS — BP 122/70 | HR 60 | Ht 62.5 in | Wt 194.4 lb

## 2017-02-28 DIAGNOSIS — I5032 Chronic diastolic (congestive) heart failure: Secondary | ICD-10-CM | POA: Diagnosis not present

## 2017-02-28 DIAGNOSIS — E7800 Pure hypercholesterolemia, unspecified: Secondary | ICD-10-CM

## 2017-02-28 DIAGNOSIS — R001 Bradycardia, unspecified: Secondary | ICD-10-CM | POA: Diagnosis not present

## 2017-02-28 DIAGNOSIS — I10 Essential (primary) hypertension: Secondary | ICD-10-CM

## 2017-02-28 HISTORY — DX: Bradycardia, unspecified: R00.1

## 2017-02-28 NOTE — Progress Notes (Signed)
Cardiology Office Note    Date:  02/28/2017   ID:  [name], DOB May 04, 1941, MRN [id number]  PCP:  System, Provider Not In  Cardiologist:  Armanda Magic, MD   Chief Complaint  Patient presents with  . Congestive Heart Failure  . Hypertension  . Hyperlipidemia    History of Present Illness:  [name] is a 76 y.o. female with a history of chronic diastolic CHF, normal LVF, normal coronary arteries by cath 2010, HTN and dyslipidemia.  She is here today for followup and is doing well. She denies any dizziness or syncope.  She occasionally has some LE edema if on her feet a long time. Her SOB has resolved after her shoulder sugery.   She denies any chest pain or pressure.  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 She has also joined a health and wellness program.   Past Medical History:  Diagnosis Date  . Chronic diastolic CHF (congestive heart failure) (HCC)   . Constipation   . Diastolic dysfunction   . Gastric reflux   . GERD (gastroesophageal reflux disease)   . Hyperlipemia    dyslipidemia  . Hypertension   . Knee pain    bilateral  . Obesity   . Shoulder pain    right shoulder  . SOB (shortness of breath) on exertion   . Swelling    feet and legs    Past Surgical History:  Procedure Laterality Date  . ABDOMINAL HYSTERECTOMY    . APPENDECTOMY  1977  . CARDIAC CATHETERIZATION  02/2009   no evidence of CAD/ Elevated LVEDP at the time ofcath c/w diastolic dysfunction  . KNEE SURGERY  1986  . NECK SURGERY    . OOPHORECTOMY  1972  . SHOULDER ARTHROSCOPY      Current Medications: Current Meds  Medication Sig  . aspirin 81 MG tablet Take 81 mg by mouth daily.  . furosemide (LASIX) 20 MG tablet Take 1 tablet (20 mg total) by mouth daily.  Marland Kitchen lisinopril (PRINIVIL,ZESTRIL) 10 MG tablet Take 1 tablet (10 mg total) by mouth daily.  Marland Kitchen lovastatin (MEVACOR) 40 MG tablet Take 1 tablet (40 mg total) by mouth at bedtime.  .  meloxicam (MOBIC) 7.5 MG tablet Take 7.5 mg by mouth 2 (two) times daily.  . metoprolol succinate (TOPROL-XL) 25 MG 24 hr tablet Take 0.5 tablets (12.5 mg total) by mouth daily.  . Multiple Vitamins-Minerals (WOMENS MULTI VITAMIN & MINERAL) TABS Take 1 tablet by mouth daily.  . Omega-3 Fatty Acids (FISH OIL) 1200 MG CAPS Take 1,200 mg by mouth 2 (two) times daily. TOTAL OF 2,400 MG DAILY  . omeprazole (PRILOSEC) 20 MG capsule Take 20 mg by mouth daily.  . Vitamin D, Ergocalciferol, (DRISDOL) 50000 units CAPS capsule Take 1 capsule (50,000 Units total) by mouth every 7 (seven) days.  Marland Kitchen zolpidem (AMBIEN) 10 MG tablet Take 5 mg by mouth at bedtime as needed for sleep.   . [DISCONTINUED] meloxicam (MOBIC) 7.5 MG tablet Take 1 tablet (7.5 mg total) by mouth daily. (Patient taking differently: Take 15 mg by mouth daily. )    Allergies:   Morphine and related   Social History   Social History  . Marital status: Married    Spouse name: [name]  . Number of children: N/A  . Years of education: N/A   Occupational History  . retired - housekeeper    Social History Main Topics  . Smoking status: Never Smoker  .  Smokeless tobacco: Never Used  . Alcohol use No  . Drug use: No  . Sexual activity: No   Other Topics Concern  . None   Social History Narrative  . None     Family History:  The patient's family history includes CAD in her brother, brother, brother, father, and sister; Diabetes in her mother; Heart attack in her father; Heart disease in her father and mother; Heart failure in her mother and sister; Hyperlipidemia in her father and mother; Hypertension in her father and mother; Other in her brother and sister; Pancreatic cancer in her brother; Stroke in her mother; Sudden death in her father.   ROS:   Please see the history of present illness.    ROS All other systems reviewed and are negative.  No flowsheet data found.     PHYSICAL EXAM:   VS:  BP 122/70   Pulse 60   Ht 5'  2.5" (1.588 m)   Wt 194 lb 6.4 oz (88.2 kg)   SpO2 97%   BMI 34.99 kg/m    GEN: Well nourished, well developed, in no acute distress  HEENT: normal  Neck: no JVD, carotid bruits, or masses Cardiac: RRR; no murmurs, rubs, or gallops,no edema.  Intact distal pulses bilaterally.  Respiratory:  clear to auscultation bilaterally, normal work of breathing GI: soft, nontender, nondistended, + BS MS: no deformity or atrophy  Skin: warm and dry, no rash Neuro:  Alert and Oriented x 3, Strength and sensation are intact Psych: euthymic mood, full affect  Wt Readings from Last 3 Encounters:  02/28/17 194 lb 6.4 oz (88.2 kg)  02/26/17 191 lb (86.6 kg)  02/10/17 197 lb (89.4 kg)      Studies/Labs Reviewed:   EKG:  EKG is not ordered today.    Recent Labs: 01/27/2017: ALT 56; BUN 29; Creatinine, Ser 0.66; Hemoglobin 13.4; Platelets 231; Potassium 4.6; Sodium 138; TSH 1.710   Lipid Panel    Component Value Date/Time   CHOL 174 01/27/2017 1040   TRIG 105 01/27/2017 1040   HDL 46 01/27/2017 1040   CHOLHDL 4.5 03/18/2016 1000   VLDL 25 03/18/2016 1000   LDLCALC 107 (H) 01/27/2017 1040    Additional studies/ records that were reviewed today include:  none    ASSESSMENT:    1. Chronic diastolic CHF (congestive heart failure) (HCC)   2. Essential hypertension, benign   3. Pure hypercholesterolemia   4. Bradycardia      PLAN:  In order of problems listed above:  1.  Chronic diastolic CHF- she appears euvolemic and well compensated and weight is stable.  She will continue on Lasix 20mg  daily. Creatinine last month stable at 0.66.  2.  HTN - BP is well controlled on exam today.Marland Kitchen. She will continue Toprol XL 12.5mg  daily and Lisinopril 10mg  daily.    3.  Dyslipidemia - continue statin.  This is followed by her PCP.  4.  Normal coronary arteries by cath 2010--nuc negative for ischemia in 2016.  She has not CP  5. Borderline Sinus bradycardia at 60 asymptomatic     Medication  Adjustments/Labs and Tests Ordered: Current medicines are reviewed at length with the patient today.  Concerns regarding medicines are outlined above.  Medication changes, Labs and Tests ordered today are listed in the Patient Instructions below.  There are no Patient Instructions on file for this visit.   Signed, Armanda Magicraci Turner, MD  02/28/2017 9:08 AM    [facility] [id number]  [address], [place], [place]  [address] Phone: [phone]; Fax: [phone]

## 2017-02-28 NOTE — Patient Instructions (Signed)
Medication Instructions:  Your physician recommends that you continue on your current medications as directed. Please refer to the Current Medication list given to you today.   Labwork: none  Testing/Procedures: none  Follow-Up: Your physician wants you to follow-up in: 12 months with Dr. Turner You will receive a reminder letter in the mail two months in advance. If you don't receive a letter, please call our office to schedule the follow-up appointment.   Any Other Special Instructions Will Be Listed Below (If Applicable).     If you need a refill on your cardiac medications before your next appointment, please call your pharmacy.   

## 2017-03-14 DIAGNOSIS — L570 Actinic keratosis: Secondary | ICD-10-CM | POA: Diagnosis not present

## 2017-03-14 DIAGNOSIS — L718 Other rosacea: Secondary | ICD-10-CM | POA: Diagnosis not present

## 2017-03-19 ENCOUNTER — Ambulatory Visit (INDEPENDENT_AMBULATORY_CARE_PROVIDER_SITE_OTHER): Payer: Medicare Other | Admitting: Family Medicine

## 2017-03-19 VITALS — BP 109/70 | HR 60 | Temp 98.2°F | Ht 62.5 in | Wt 187.0 lb

## 2017-03-19 DIAGNOSIS — E559 Vitamin D deficiency, unspecified: Secondary | ICD-10-CM | POA: Diagnosis not present

## 2017-03-19 DIAGNOSIS — Z6834 Body mass index (BMI) 34.0-34.9, adult: Secondary | ICD-10-CM | POA: Diagnosis not present

## 2017-03-19 DIAGNOSIS — E669 Obesity, unspecified: Secondary | ICD-10-CM | POA: Diagnosis not present

## 2017-03-19 NOTE — Progress Notes (Signed)
Office: [phone]  /  Fax: [phone]   HPI:   Chief Complaint: OBESITY [name] is here to discuss her progress with her obesity treatment plan. She is on the  follow the Category 2 plan and is following her eating plan approximately 100 % of the time. She states she is exercising weights and walking for 60/15 minutes 7 times per week. [name] continues to do well with weight loss. She notes some family sabotage but is doing better with making smart choices and increasing lean protein and vegetables from her garden. Her weight is 187 lb (84.8 kg) today and has had a weight loss of 4 pounds over a period of 2 weeks since her last visit. She has lost 15 lbs since starting treatment with [place].  Vitamin D deficiency [name] has a diagnosis of vitamin D deficiency. She is currently stable on vit D, not yet at goal and denies nausea, vomiting or muscle weakness.   ALLERGIES: Allergies  Allergen Reactions  . Morphine And Related Nausea And Vomiting    MEDICATIONS: Current Outpatient Prescriptions on File Prior to Visit  Medication Sig Dispense Refill  . aspirin 81 MG tablet Take 81 mg by mouth daily.    . furosemide (LASIX) 20 MG tablet Take 1 tablet (20 mg total) by mouth daily. 90 tablet 3  . lisinopril (PRINIVIL,ZESTRIL) 10 MG tablet Take 1 tablet (10 mg total) by mouth daily. 90 tablet 3  . lovastatin (MEVACOR) 40 MG tablet Take 1 tablet (40 mg total) by mouth at bedtime. 90 tablet 3  . meloxicam (MOBIC) 7.5 MG tablet Take 7.5 mg by mouth 2 (two) times daily.    . metoprolol succinate (TOPROL-XL) 25 MG 24 hr tablet Take 0.5 tablets (12.5 mg total) by mouth daily. 45 tablet 3  . Multiple Vitamins-Minerals (WOMENS MULTI VITAMIN & MINERAL) TABS Take 1 tablet by mouth daily.    . Omega-3 Fatty Acids (FISH OIL) 1200 MG CAPS Take 1,200 mg by mouth 2 (two) times daily. TOTAL OF 2,400 MG DAILY    . omeprazole (PRILOSEC) 20 MG capsule Take 20 mg by mouth daily.    . Vitamin D, Ergocalciferol,  (DRISDOL) 50000 units CAPS capsule Take 1 capsule (50,000 Units total) by mouth every 7 (seven) days. 4 capsule 0  . zolpidem (AMBIEN) 10 MG tablet Take 5 mg by mouth at bedtime as needed for sleep.     . [DISCONTINUED] Omeprazole (PRILOSEC PO) Take 20 mg by mouth daily.      No current facility-administered medications on file prior to visit.     PAST MEDICAL HISTORY: Past Medical History:  Diagnosis Date  . Chronic diastolic CHF (congestive heart failure) (HCC)   . Constipation   . Diastolic dysfunction   . Gastric reflux   . GERD (gastroesophageal reflux disease)   . Hyperlipemia    dyslipidemia  . Hypertension   . Knee pain    bilateral  . Obesity   . Shoulder pain    right shoulder  . SOB (shortness of breath) on exertion   . Swelling    feet and legs    PAST SURGICAL HISTORY: Past Surgical History:  Procedure Laterality Date  . ABDOMINAL HYSTERECTOMY    . APPENDECTOMY  1977  . CARDIAC CATHETERIZATION  02/2009   no evidence of CAD/ Elevated LVEDP at the time ofcath c/w diastolic dysfunction  . KNEE SURGERY  1986  . NECK SURGERY    . OOPHORECTOMY  1972  . SHOULDER ARTHROSCOPY  SOCIAL HISTORY: Social History  Substance Use Topics  . Smoking status: Never Smoker  . Smokeless tobacco: Never Used  . Alcohol use No    FAMILY HISTORY: Family History  Problem Relation Age of Onset  . Heart failure Mother   . Diabetes Mother   . Hypertension Mother   . Hyperlipidemia Mother   . Stroke Mother   . Heart disease Mother   . CAD Father   . Heart attack Father   . Hypertension Father   . Hyperlipidemia Father   . Heart disease Father   . Sudden death Father   . CAD Sister   . CAD Brother   . Pancreatic cancer Brother   . CAD Brother   . CAD Brother   . Heart failure Sister   . Other Brother        OPEN HEART  . Other Sister        OPEN HEART    ROS: Review of Systems  Constitutional: Positive for weight loss.  Gastrointestinal: Negative for  nausea and vomiting.  Musculoskeletal:       Negative muscle weakness    PHYSICAL EXAM: Blood pressure 109/70, pulse 60, temperature 98.2 F (36.8 C), height 5' 2.5" (1.588 m), weight 187 lb (84.8 kg), SpO2 96 %. Body mass index is 33.66 kg/m. Physical Exam  Constitutional: She is oriented to person, place, and time. She appears well-developed and well-nourished.  Cardiovascular: Normal rate.   Pulmonary/Chest: Effort normal.  Musculoskeletal: Normal range of motion.  Neurological: She is oriented to person, place, and time.  Skin: Skin is warm and dry.  Psychiatric: She has a normal mood and affect. Her behavior is normal.  Vitals reviewed.   RECENT LABS AND TESTS: BMET    Component Value Date/Time   NA 138 01/27/2017 1054   K 4.6 01/27/2017 1054   CL 98 01/27/2017 1054   CO2 24 01/27/2017 1054   GLUCOSE 95 01/27/2017 1054   GLUCOSE 104 (H) 03/18/2016 1000   BUN 29 (H) 01/27/2017 1054   CREATININE 0.66 01/27/2017 1054   CREATININE 0.70 03/18/2016 1000   CALCIUM 9.6 01/27/2017 1054   GFRNONAA 88 01/27/2017 1054   GFRAA 101 01/27/2017 1054   Lab Results  Component Value Date   HGBA1C 5.7 (H) 01/27/2017   Lab Results  Component Value Date   INSULIN 16.7 01/27/2017   CBC    Component Value Date/Time   WBC 7.3 01/27/2017 1040   WBC 10.0 04/22/2011 1439   RBC 4.31 01/27/2017 1040   RBC 4.08 04/22/2011 1439   HGB 13.4 01/27/2017 1040   HCT 41.1 01/27/2017 1040   PLT 231 01/27/2017 1040   MCV 95 01/27/2017 1040   MCH 31.1 01/27/2017 1040   MCH 31.9 04/22/2011 1439   MCHC 32.6 01/27/2017 1040   MCHC 34.6 04/22/2011 1439   RDW 13.3 01/27/2017 1040   LYMPHSABS 2.1 01/27/2017 1040   MONOABS 0.5 04/22/2011 1439   EOSABS 0.1 01/27/2017 1040   BASOSABS 0.0 01/27/2017 1040   Iron/TIBC/Ferritin/ %Sat No results found for: IRON, TIBC, FERRITIN, IRONPCTSAT Lipid Panel     Component Value Date/Time   CHOL 174 01/27/2017 1040   TRIG 105 01/27/2017 1040   HDL 46  01/27/2017 1040   CHOLHDL 4.5 03/18/2016 1000   VLDL 25 03/18/2016 1000   LDLCALC 107 (H) 01/27/2017 1040   Hepatic Function Panel     Component Value Date/Time   PROT 7.1 01/27/2017 1054   ALBUMIN 4.8 01/27/2017 1054  AST 45 (H) 01/27/2017 1054   ALT 56 (H) 01/27/2017 1054   ALKPHOS 100 01/27/2017 1054   BILITOT 0.6 01/27/2017 1054   BILIDIR 0.1 02/07/2015 1521      Component Value Date/Time   TSH 1.710 01/27/2017 1040    ASSESSMENT AND PLAN: Vitamin D deficiency  Class 1 obesity with serious comorbidity and body mass index (BMI) of 34.0 to 34.9 in adult, unspecified obesity type  PLAN:  Vitamin D Deficiency [name] was informed that low vitamin D levels contributes to fatigue and are associated with obesity, breast, and colon cancer. She agrees to continue to take prescription Vit D @50 ,000 IU every week, we will refill for 1 month and will follow up for routine testing of vitamin D, at least 2-3 times per year. She was informed of the risk of over-replacement of vitamin D and agrees to not increase her dose unless he discusses this with us first. [name] agrees to follow up with our clinic in 2 weeks.  Obesity [name] is currently in the action stage of change. As such, her goal is to continue with weight loss efforts She has agreed to follow the Category 2 plan [name] has been instructed to work up to a goal of 150 minutes of combined cardio and strengthening exercise per week for weight loss and overall health benefits. We discussed the following Behavioral Modification Strategies today: meal planning & cooking strategies, increasing lean protein intake and dealing with family or coworker sabotage  [name] has agreed to follow up with our clinic in 2 weeks. She was informed of the importance of frequent follow up visits to maximize her success with intensive lifestyle modifications for her multiple health conditions.  I, [place] CraneJoanne Murray, am acting as transcriptionist for Quillian Quincearen  Beasley, MD  I have reviewed the above documentation for accuracy and completeness, and I agree with the above. -Quillian Quincearen Beasley, MD  OBESITY BEHAVIORAL INTERVENTION VISIT  Today's visit was # 4 out of 22.  Starting weight: 202 lbs Starting date: 01/27/17 Today's weight : 187 lbs Today's date: 03/19/2017 Total lbs lost to date: 15 (Patients must lose 7 lbs in the first 6 months to continue with counseling)   ASK: We discussed the diagnosis of obesity with Pilar JarvisLinda S Bonenberger today and [name] agreed to give us permission to discuss obesity behavioral modification therapy today.  ASSESS: [name] has the diagnosis of obesity and her BMI today is 33.7 [name] is in the action stage of change   ADVISE: [name] was educated on the multiple health risks of obesity as well as the benefit of weight loss to improve her health. She was advised of the need for long term treatment and the importance of lifestyle modifications.  AGREE: Multiple dietary modification options and treatment options were discussed and  [name] agreed to follow the Category 2 plan We discussed the following Behavioral Modification Strategies today: meal planning & cooking strategies, increasing lean protein intake and dealing with family or coworker sabotage

## 2017-03-22 ENCOUNTER — Other Ambulatory Visit (INDEPENDENT_AMBULATORY_CARE_PROVIDER_SITE_OTHER): Payer: Self-pay | Admitting: Family Medicine

## 2017-03-22 DIAGNOSIS — E559 Vitamin D deficiency, unspecified: Secondary | ICD-10-CM

## 2017-04-02 ENCOUNTER — Ambulatory Visit (INDEPENDENT_AMBULATORY_CARE_PROVIDER_SITE_OTHER): Payer: Medicare Other | Admitting: Family Medicine

## 2017-04-02 VITALS — BP 120/76 | HR 57 | Temp 97.8°F | Ht 62.0 in | Wt 184.0 lb

## 2017-04-02 DIAGNOSIS — E559 Vitamin D deficiency, unspecified: Secondary | ICD-10-CM

## 2017-04-02 DIAGNOSIS — Z6833 Body mass index (BMI) 33.0-33.9, adult: Secondary | ICD-10-CM | POA: Diagnosis not present

## 2017-04-02 DIAGNOSIS — I10 Essential (primary) hypertension: Secondary | ICD-10-CM | POA: Diagnosis not present

## 2017-04-02 DIAGNOSIS — E669 Obesity, unspecified: Secondary | ICD-10-CM | POA: Diagnosis not present

## 2017-04-02 MED ORDER — VITAMIN D (ERGOCALCIFEROL) 1.25 MG (50000 UNIT) PO CAPS: 50000.0000 [IU] | ORAL_CAPSULE | ORAL | 0 refills | Status: DC

## 2017-04-02 NOTE — Progress Notes (Signed)
Office: [phone]  /  Fax: [phone]   HPI:   Chief Complaint: OBESITY [name] is here to discuss her progress with her obesity treatment plan. She is on the  follow the Category 2 plan and is following her eating plan approximately 100 % of the time. She states she is exercising 30 minutes 3 to 5 times per week. [name] continues to do well with weight loss. She plans ahead well and is sticking to the diet plan. Her weight is 184 lb (83.5 kg) today and has had a weight loss of 3 pounds over a period of 2 weeks since her last visit. She has lost 18 lbs since starting treatment with us.  Vitamin D deficiency [name] has a diagnosis of vitamin D deficiency. She is currently taking vit D and denies nausea, vomiting or muscle weakness.  Hypertension [name] is a 76 y.o. female with hypertension.  [name] denies chest pain or shortness of breath on exertion. She is working weight loss to help control her blood Rosario with the goal of decreasing her risk of heart attack and stroke. [name] is currently controlled.    ALLERGIES: Allergies  Allergen Reactions  . Morphine And Related Nausea And Vomiting    MEDICATIONS: Current Outpatient Prescriptions on File Prior to Visit  Medication Sig Dispense Refill  . aspirin 81 MG tablet Take 81 mg by mouth daily.    . furosemide (LASIX) 20 MG tablet Take 1 tablet (20 mg total) by mouth daily. 90 tablet 3  . lisinopril (PRINIVIL,ZESTRIL) 10 MG tablet Take 1 tablet (10 mg total) by mouth daily. 90 tablet 3  . lovastatin (MEVACOR) 40 MG tablet Take 1 tablet (40 mg total) by mouth at bedtime. 90 tablet 3  . meloxicam (MOBIC) 7.5 MG tablet Take 7.5 mg by mouth 2 (two) times daily.    . metoprolol succinate (TOPROL-XL) 25 MG 24 hr tablet Take 0.5 tablets (12.5 mg total) by mouth daily. 45 tablet 3  . Multiple Vitamins-Minerals (WOMENS MULTI VITAMIN & MINERAL) TABS Take 1 tablet by mouth daily.    . Omega-3 Fatty  Acids (FISH OIL) 1200 MG CAPS Take 1,200 mg by mouth 2 (two) times daily. TOTAL OF 2,400 MG DAILY    . omeprazole (PRILOSEC) 20 MG capsule Take 20 mg by mouth daily.    Marland Kitchen. zolpidem (AMBIEN) 10 MG tablet Take 5 mg by mouth at bedtime as needed for sleep.     . [DISCONTINUED] Omeprazole (PRILOSEC PO) Take 20 mg by mouth daily.      No current facility-administered medications on file prior to visit.     PAST MEDICAL HISTORY: Past Medical History:  Diagnosis Date  . Chronic diastolic CHF (congestive heart failure) (HCC)   . Constipation   . Diastolic dysfunction   . Gastric reflux   . GERD (gastroesophageal reflux disease)   . Hyperlipemia    dyslipidemia  . Hypertension   . Knee pain    bilateral  . Obesity   . Shoulder pain    right shoulder  . SOB (shortness of breath) on exertion   . Swelling    feet and legs    PAST SURGICAL HISTORY: Past Surgical History:  Procedure Laterality Date  . ABDOMINAL HYSTERECTOMY    . APPENDECTOMY  1977  . CARDIAC CATHETERIZATION  02/2009   no evidence of CAD/ Elevated LVEDP at the time ofcath c/w diastolic dysfunction  . KNEE SURGERY  1986  . NECK SURGERY    .  OOPHORECTOMY  1972  . SHOULDER ARTHROSCOPY      SOCIAL HISTORY: Social History  Substance Use Topics  . Smoking status: Never Smoker  . Smokeless tobacco: Never Used  . Alcohol use No    FAMILY HISTORY: Family History  Problem Relation Age of Onset  . Heart failure Mother   . Diabetes Mother   . Hypertension Mother   . Hyperlipidemia Mother   . Stroke Mother   . Heart disease Mother   . CAD Father   . Heart attack Father   . Hypertension Father   . Hyperlipidemia Father   . Heart disease Father   . Sudden death Father   . CAD Sister   . CAD Brother   . Pancreatic cancer Brother   . CAD Brother   . CAD Brother   . Heart failure Sister   . Other Brother        OPEN HEART  . Other Sister        OPEN HEART    ROS: Review of Systems  Constitutional: Positive  for weight loss.  Respiratory: Negative for shortness of breath (on exertion).   Cardiovascular: Negative for chest pain.  Gastrointestinal: Negative for nausea and vomiting.  Musculoskeletal:       Negative muscle weakness    PHYSICAL EXAM: Blood Rosario 120/76, pulse (!) 57, temperature 97.8 F (36.6 C), temperature source Oral, height 5\' 2"  (1.575 m), weight 184 lb (83.5 kg), SpO2 100 %. Body mass index is 33.65 kg/m. Physical Exam  Constitutional: She is oriented to person, place, and time. She appears well-developed and well-nourished.  Cardiovascular: Normal rate.   Pulmonary/Chest: Effort normal.  Musculoskeletal: Normal range of motion.  Neurological: She is oriented to person, place, and time.  Skin: Skin is warm and dry.  Psychiatric: She has a normal mood and affect. Her behavior is normal.  Vitals reviewed.   RECENT LABS AND TESTS: BMET    Component Value Date/Time   NA 138 01/27/2017 1054   K 4.6 01/27/2017 1054   CL 98 01/27/2017 1054   CO2 24 01/27/2017 1054   GLUCOSE 95 01/27/2017 1054   GLUCOSE 104 (H) 03/18/2016 1000   BUN 29 (H) 01/27/2017 1054   CREATININE 0.66 01/27/2017 1054   CREATININE 0.70 03/18/2016 1000   CALCIUM 9.6 01/27/2017 1054   GFRNONAA 88 01/27/2017 1054   GFRAA 101 01/27/2017 1054   Lab Results  Component Value Date   HGBA1C 5.7 (H) 01/27/2017   Lab Results  Component Value Date   INSULIN 16.7 01/27/2017   CBC    Component Value Date/Time   WBC 7.3 01/27/2017 1040   WBC 10.0 04/22/2011 1439   RBC 4.31 01/27/2017 1040   RBC 4.08 04/22/2011 1439   HGB 13.4 01/27/2017 1040   HCT 41.1 01/27/2017 1040   PLT 231 01/27/2017 1040   MCV 95 01/27/2017 1040   MCH 31.1 01/27/2017 1040   MCH 31.9 04/22/2011 1439   MCHC 32.6 01/27/2017 1040   MCHC 34.6 04/22/2011 1439   RDW 13.3 01/27/2017 1040   LYMPHSABS 2.1 01/27/2017 1040   MONOABS 0.5 04/22/2011 1439   EOSABS 0.1 01/27/2017 1040   BASOSABS 0.0 01/27/2017 1040    Iron/TIBC/Ferritin/ %Sat No results found for: IRON, TIBC, FERRITIN, IRONPCTSAT Lipid Panel     Component Value Date/Time   CHOL 174 01/27/2017 1040   TRIG 105 01/27/2017 1040   HDL 46 01/27/2017 1040   CHOLHDL 4.5 03/18/2016 1000   VLDL 25 03/18/2016  1000   LDLCALC 107 (H) 01/27/2017 1040   Hepatic Function Panel     Component Value Date/Time   PROT 7.1 01/27/2017 1054   ALBUMIN 4.8 01/27/2017 1054   AST 45 (H) 01/27/2017 1054   ALT 56 (H) 01/27/2017 1054   ALKPHOS 100 01/27/2017 1054   BILITOT 0.6 01/27/2017 1054   BILIDIR 0.1 02/07/2015 1521      Component Value Date/Time   TSH 1.710 01/27/2017 1040    ASSESSMENT AND PLAN: Vitamin D deficiency - Plan: Vitamin D, Ergocalciferol, (DRISDOL) 50000 units CAPS capsule  Essential hypertension  Class 1 obesity with serious comorbidity and body mass index (BMI) of 33.0 to 33.9 in adult, unspecified obesity type  PLAN:  Vitamin D Deficiency [name] was informed that low vitamin D levels contributes to fatigue and are associated with obesity, breast, and colon cancer. She agrees to continue to take prescription Vit D @50 ,000 IU every week, we will refill for 1 month and will follow up for routine testing of vitamin D, at least 2-3 times per year. She was informed of the risk of over-replacement of vitamin D and agrees to not increase her dose unless he discusses this with [place] first. [name] agrees to follow up with our clinic in 2 weeks.  Hypertension We discussed sodium restriction, working on healthy weight loss, and a regular exercise program as the means to achieve improved blood Rosario control. [name] agreed with this plan and agreed to follow up as directed. We will continue to monitor her blood Rosario as well as her progress with the above lifestyle modifications. She will continue her medications as prescribed and will watch for signs of hypotension as she continues her lifestyle modifications.  Obesity [name] is  currently in the action stage of change. As such, her goal is to continue with weight loss efforts She has agreed to keep a food journal with 400 to 500 calories and 40+ grams of protein at supper daily and follow the Category 2 plan [name] has been instructed to work up to a goal of 150 minutes of combined cardio and strengthening exercise per week for weight loss and overall health benefits. We discussed the following Behavioral Modification Strategies today: increasing lean protein intake and keep a strict food journal  [name] has agreed to follow up with our clinic in 2 weeks. She was informed of the importance of frequent follow up visits to maximize her success with intensive lifestyle modifications for her multiple health conditions.  I, [place] Crane, am acting as transcriptionist for Illa Level, PA-C  I have reviewed the above documentation for accuracy and completeness, and I agree with the above. -Illa Level, PA-C  I have reviewed the above note and agree with the plan. -Quillian Quince, MD   OBESITY BEHAVIORAL INTERVENTION VISIT  Today's visit was # 5 out of 22.  Starting weight: 202 lbs Starting date: 01/27/17 Today's weight : 184 lbs Today's date: 04/02/2017 Total lbs lost to date: 34 (Patients must lose 7 lbs in the first 6 months to continue with counseling)   ASK: We discussed the diagnosis of obesity with Jill Jarvis Plake today and [name] agreed to give [place] permission to discuss obesity behavioral modification therapy today.  ASSESS: [name] has the diagnosis of obesity and her BMI today is 33.7 [name] is in the action stage of change   ADVISE: [name] was educated on the multiple health risks of obesity as well as the benefit of weight loss to improve her health. She  was advised of the need for long term treatment and the importance of lifestyle modifications.  AGREE: Multiple dietary modification options and treatment options were discussed and  [name] agreed to keep a  food journal with 400 to 500 calories and 40+ grams of protein at supper daily and follow the Category 2 plan We discussed the following Behavioral Modification Strategies today: increasing lean protein intake and keep a strict food journal

## 2017-04-16 ENCOUNTER — Ambulatory Visit (INDEPENDENT_AMBULATORY_CARE_PROVIDER_SITE_OTHER): Payer: Medicare Other | Admitting: Physician Assistant

## 2017-04-16 VITALS — BP 133/75 | HR 61 | Temp 98.4°F | Ht 62.0 in | Wt 182.0 lb

## 2017-04-16 DIAGNOSIS — E669 Obesity, unspecified: Secondary | ICD-10-CM

## 2017-04-16 DIAGNOSIS — Z6833 Body mass index (BMI) 33.0-33.9, adult: Secondary | ICD-10-CM

## 2017-04-16 DIAGNOSIS — E559 Vitamin D deficiency, unspecified: Secondary | ICD-10-CM

## 2017-04-17 NOTE — Progress Notes (Signed)
Office: [phone]  /  Fax: [phone]   HPI:   Chief Complaint: OBESITY [name] is here to discuss her progress with her obesity treatment plan. She is on the  keep a food journal with 400-500 calories and 40+g protein  and follow the Category 2 plan and is following her eating plan approximately 100 % of the time. She states she walks a mile and lifts weight for 28 minutes 4 times per week. [name] continues to do well with weight loss. Has been keep a good food journal. Hunger well controlled and incorporates variety.  Her weight is 182 lb (82.6 kg) today and has had a weight loss of 2 pounds over a period of 2 weeks since her last visit. She has lost 20 lbs since starting treatment with [place].  Vitamin D deficiency [name] has a diagnosis of vitamin D deficiency. She is currently taking vit D and denies nausea, vomiting or muscle weakness.   ALLERGIES: Allergies  Allergen Reactions  . Morphine And Related Nausea And Vomiting    MEDICATIONS: Current Outpatient Prescriptions on File Prior to Visit  Medication Sig Dispense Refill  . aspirin 81 MG tablet Take 81 mg by mouth daily.    . furosemide (LASIX) 20 MG tablet Take 1 tablet (20 mg total) by mouth daily. 90 tablet 3  . lisinopril (PRINIVIL,ZESTRIL) 10 MG tablet Take 1 tablet (10 mg total) by mouth daily. 90 tablet 3  . lovastatin (MEVACOR) 40 MG tablet Take 1 tablet (40 mg total) by mouth at bedtime. 90 tablet 3  . meloxicam (MOBIC) 7.5 MG tablet Take 7.5 mg by mouth 2 (two) times daily.    . metoprolol succinate (TOPROL-XL) 25 MG 24 hr tablet Take 0.5 tablets (12.5 mg total) by mouth daily. 45 tablet 3  . Multiple Vitamins-Minerals (WOMENS MULTI VITAMIN & MINERAL) TABS Take 1 tablet by mouth daily.    . Omega-3 Fatty Acids (FISH OIL) 1200 MG CAPS Take 1,200 mg by mouth 2 (two) times daily. TOTAL OF 2,400 MG DAILY    . omeprazole (PRILOSEC) 20 MG capsule Take 20 mg by mouth daily.    . Vitamin D, Ergocalciferol, (DRISDOL) 50000  units CAPS capsule Take 1 capsule (50,000 Units total) by mouth every 7 (seven) days. 4 capsule 0  . zolpidem (AMBIEN) 10 MG tablet Take 5 mg by mouth at bedtime as needed for sleep.     . [DISCONTINUED] Omeprazole (PRILOSEC PO) Take 20 mg by mouth daily.      No current facility-administered medications on file prior to visit.     PAST MEDICAL HISTORY: Past Medical History:  Diagnosis Date  . Chronic diastolic CHF (congestive heart failure) (HCC)   . Constipation   . Diastolic dysfunction   . Gastric reflux   . GERD (gastroesophageal reflux disease)   . Hyperlipemia    dyslipidemia  . Hypertension   . Knee pain    bilateral  . Obesity   . Shoulder pain    right shoulder  . SOB (shortness of breath) on exertion   . Swelling    feet and legs    PAST SURGICAL HISTORY: Past Surgical History:  Procedure Laterality Date  . ABDOMINAL HYSTERECTOMY    . APPENDECTOMY  1977  . CARDIAC CATHETERIZATION  02/2009   no evidence of CAD/ Elevated LVEDP at the time ofcath c/w diastolic dysfunction  . KNEE SURGERY  1986  . NECK SURGERY    . OOPHORECTOMY  1972  . SHOULDER ARTHROSCOPY  SOCIAL HISTORY: Social History  Substance Use Topics  . Smoking status: Never Smoker  . Smokeless tobacco: Never Used  . Alcohol use No    FAMILY HISTORY: Family History  Problem Relation Age of Onset  . Heart failure Mother   . Diabetes Mother   . Hypertension Mother   . Hyperlipidemia Mother   . Stroke Mother   . Heart disease Mother   . CAD Father   . Heart attack Father   . Hypertension Father   . Hyperlipidemia Father   . Heart disease Father   . Sudden death Father   . CAD Sister   . CAD Brother   . Pancreatic cancer Brother   . CAD Brother   . CAD Brother   . Heart failure Sister   . Other Brother        OPEN HEART  . Other Sister        OPEN HEART    ROS: Review of Systems  Constitutional: Positive for weight loss.  Gastrointestinal: Negative for nausea and vomiting.   Musculoskeletal:       Negative muscle weakness    PHYSICAL EXAM: Blood pressure 133/75, pulse 61, temperature 98.4 F (36.9 C), temperature source Oral, height 5\' 2"  (1.575 m), weight 182 lb (82.6 kg), SpO2 97 %. Body mass index is 33.29 kg/m. Physical Exam  Constitutional: She is oriented to person, place, and time. She appears well-developed and well-nourished.  Cardiovascular: Normal rate.   Pulmonary/Chest: Effort normal.  Musculoskeletal: Normal range of motion.  Neurological: She is alert and oriented to person, place, and time.  Skin: Skin is warm and dry.  Psychiatric: She has a normal mood and affect.    RECENT LABS AND TESTS: BMET    Component Value Date/Time   NA 138 01/27/2017 1054   K 4.6 01/27/2017 1054   CL 98 01/27/2017 1054   CO2 24 01/27/2017 1054   GLUCOSE 95 01/27/2017 1054   GLUCOSE 104 (H) 03/18/2016 1000   BUN 29 (H) 01/27/2017 1054   CREATININE 0.66 01/27/2017 1054   CREATININE 0.70 03/18/2016 1000   CALCIUM 9.6 01/27/2017 1054   GFRNONAA 88 01/27/2017 1054   GFRAA 101 01/27/2017 1054   Lab Results  Component Value Date   HGBA1C 5.7 (H) 01/27/2017   Lab Results  Component Value Date   INSULIN 16.7 01/27/2017   CBC    Component Value Date/Time   WBC 7.3 01/27/2017 1040   WBC 10.0 04/22/2011 1439   RBC 4.31 01/27/2017 1040   RBC 4.08 04/22/2011 1439   HGB 13.4 01/27/2017 1040   HCT 41.1 01/27/2017 1040   PLT 231 01/27/2017 1040   MCV 95 01/27/2017 1040   MCH 31.1 01/27/2017 1040   MCH 31.9 04/22/2011 1439   MCHC 32.6 01/27/2017 1040   MCHC 34.6 04/22/2011 1439   RDW 13.3 01/27/2017 1040   LYMPHSABS 2.1 01/27/2017 1040   MONOABS 0.5 04/22/2011 1439   EOSABS 0.1 01/27/2017 1040   BASOSABS 0.0 01/27/2017 1040   Iron/TIBC/Ferritin/ %Sat No results found for: IRON, TIBC, FERRITIN, IRONPCTSAT Lipid Panel     Component Value Date/Time   CHOL 174 01/27/2017 1040   TRIG 105 01/27/2017 1040   HDL 46 01/27/2017 1040   CHOLHDL 4.5  03/18/2016 1000   VLDL 25 03/18/2016 1000   LDLCALC 107 (H) 01/27/2017 1040   Hepatic Function Panel     Component Value Date/Time   PROT 7.1 01/27/2017 1054   ALBUMIN 4.8 01/27/2017 1054  AST 45 (H) 01/27/2017 1054   ALT 56 (H) 01/27/2017 1054   ALKPHOS 100 01/27/2017 1054   BILITOT 0.6 01/27/2017 1054   BILIDIR 0.1 02/07/2015 1521      Component Value Date/Time   TSH 1.710 01/27/2017 1040    ASSESSMENT AND PLAN: Vitamin D deficiency  Class 1 obesity with serious comorbidity and body mass index (BMI) of 33.0 to 33.9 in adult, unspecified obesity type  PLAN:  Vitamin D Deficiency [name] was informed that low vitamin D levels contributes to fatigue and are associated with obesity, breast, and colon cancer. She agrees to continue to take prescription Vit D @50 ,000 IU every week and will follow up for routine testing of vitamin D, at least 2-3 times per year. She was informed of the risk of over-replacement of vitamin D and agrees to not increase her dose unless he discusses this with [place] first.  Obesity [name] is currently in the action stage of change. As such, her goal is to continue with weight loss efforts She has agreed to keep a food journal with 400-500 calories and 40+g protein for supper and follow the category 2 plan. [name] has been instructed to work up to a goal of 150 minutes of combined cardio and strengthening exercise per week for weight loss and overall health benefits. We discussed the following Behavioral Modification Stratagies today: increasing lean protein intake and work on meal planning and easy cooking plans   [name] has agreed to follow up with our clinic in 3 weeks. She was informed of the importance of frequent follow up visits to maximize her success with intensive lifestyle modifications for her multiple health conditions.  We spent > than 50% of the 15 minute visit on the counseling as documented in the note.   Office: [phone]  /  Fax:  [phone]  OBESITY BEHAVIORAL INTERVENTION VISIT  Today's visit was # 6 out of 22.  Starting weight: 202 Starting date: 01/27/17 Today's weight : Weight: 182 lb (82.6 kg)  Today's date: 04/17/2017 Total lbs lost to date: 20 (Patients must lose 7 lbs in the first 6 months to continue with counseling)   ASK: We discussed the diagnosis of obesity with [name] today and [name] agreed to give [place] permission to discuss obesity behavioral modification therapy today.  ASSESS: [name] has the diagnosis of obesity and her BMI today is 33.4 [name] is in the action stage of change   ADVISE: [name] was educated on the multiple health risks of obesity as well as the benefit of weight loss to improve her health. She was advised of the need for long term treatment and the importance of lifestyle modifications.  AGREE: Multiple dietary modification options and treatment options were discussed and  [name] agreed to keep a food journal with 400-500  calories and 40+g protein at supper protein  and follow the Category 2 plan We discussed the following Behavioral Modification Stratagies today: increasing lean protein intake and work on meal planning and easy cooking plans    I have reviewed the above documentation for accuracy and completeness, and I agree with the above. -Illa Level, PA-C  I have reviewed the above note and agree with the plan. -Quillian Quince, MD

## 2017-04-23 DIAGNOSIS — Z961 Presence of intraocular lens: Secondary | ICD-10-CM | POA: Diagnosis not present

## 2017-04-23 DIAGNOSIS — H40013 Open angle with borderline findings, low risk, bilateral: Secondary | ICD-10-CM | POA: Diagnosis not present

## 2017-04-23 DIAGNOSIS — H35373 Puckering of macula, bilateral: Secondary | ICD-10-CM | POA: Diagnosis not present

## 2017-04-23 DIAGNOSIS — H35033 Hypertensive retinopathy, bilateral: Secondary | ICD-10-CM | POA: Diagnosis not present

## 2017-04-28 DIAGNOSIS — M545 Low back pain: Secondary | ICD-10-CM | POA: Diagnosis not present

## 2017-04-28 DIAGNOSIS — M25551 Pain in right hip: Secondary | ICD-10-CM | POA: Diagnosis not present

## 2017-05-02 DIAGNOSIS — M9903 Segmental and somatic dysfunction of lumbar region: Secondary | ICD-10-CM | POA: Diagnosis not present

## 2017-05-02 DIAGNOSIS — M9904 Segmental and somatic dysfunction of sacral region: Secondary | ICD-10-CM | POA: Diagnosis not present

## 2017-05-02 DIAGNOSIS — M5136 Other intervertebral disc degeneration, lumbar region: Secondary | ICD-10-CM | POA: Diagnosis not present

## 2017-05-02 DIAGNOSIS — M5137 Other intervertebral disc degeneration, lumbosacral region: Secondary | ICD-10-CM | POA: Diagnosis not present

## 2017-05-05 DIAGNOSIS — M5137 Other intervertebral disc degeneration, lumbosacral region: Secondary | ICD-10-CM | POA: Diagnosis not present

## 2017-05-05 DIAGNOSIS — M5136 Other intervertebral disc degeneration, lumbar region: Secondary | ICD-10-CM | POA: Diagnosis not present

## 2017-05-05 DIAGNOSIS — M9904 Segmental and somatic dysfunction of sacral region: Secondary | ICD-10-CM | POA: Diagnosis not present

## 2017-05-05 DIAGNOSIS — M9903 Segmental and somatic dysfunction of lumbar region: Secondary | ICD-10-CM | POA: Diagnosis not present

## 2017-05-06 ENCOUNTER — Ambulatory Visit (INDEPENDENT_AMBULATORY_CARE_PROVIDER_SITE_OTHER): Payer: Medicare Other | Admitting: Physician Assistant

## 2017-05-06 VITALS — BP 130/70 | HR 52 | Temp 98.2°F | Ht 62.0 in | Wt 178.0 lb

## 2017-05-06 DIAGNOSIS — I10 Essential (primary) hypertension: Secondary | ICD-10-CM

## 2017-05-06 DIAGNOSIS — Z6832 Body mass index (BMI) 32.0-32.9, adult: Secondary | ICD-10-CM | POA: Diagnosis not present

## 2017-05-06 DIAGNOSIS — E669 Obesity, unspecified: Secondary | ICD-10-CM

## 2017-05-06 DIAGNOSIS — E559 Vitamin D deficiency, unspecified: Secondary | ICD-10-CM | POA: Diagnosis not present

## 2017-05-06 HISTORY — DX: Essential (primary) hypertension: I10

## 2017-05-06 HISTORY — DX: Vitamin D deficiency, unspecified: E55.9

## 2017-05-06 MED ORDER — VITAMIN D (ERGOCALCIFEROL) 1.25 MG (50000 UNIT) PO CAPS: 50000.0000 [IU] | ORAL_CAPSULE | ORAL | 0 refills | Status: DC

## 2017-05-07 DIAGNOSIS — M9903 Segmental and somatic dysfunction of lumbar region: Secondary | ICD-10-CM | POA: Diagnosis not present

## 2017-05-07 DIAGNOSIS — M5137 Other intervertebral disc degeneration, lumbosacral region: Secondary | ICD-10-CM | POA: Diagnosis not present

## 2017-05-07 DIAGNOSIS — M5136 Other intervertebral disc degeneration, lumbar region: Secondary | ICD-10-CM | POA: Diagnosis not present

## 2017-05-07 DIAGNOSIS — M9904 Segmental and somatic dysfunction of sacral region: Secondary | ICD-10-CM | POA: Diagnosis not present

## 2017-05-07 NOTE — Progress Notes (Signed)
Office: [phone]  /  Fax: [phone]   HPI:   Chief Complaint: OBESITY [name] is here to discuss her progress with her obesity treatment plan. She is on the keep a food journal with 400-500 calories and 40+ protein and follow the Category 2 plan daily and is following her eating plan approximately 100 % of the time. She states she is exercising walking and weights 30 minutes 5 to 6 times per week. [name] continues to do well with weight loss and has been adding variety to her meals. Her hunger is well controlled.  Her weight is 178 lb (80.7 kg) today and has had a weight loss of 4 pounds over a period of 3 weeks since her last visit. She has lost 24 lbs since starting treatment with [place].  Vitamin D deficiency [name] has a diagnosis of vitamin D deficiency. She is currently taking vit D and denies nausea, vomiting or muscle weakness.  Hypertension [name] is a 76 y.o. female with hypertension. [name] denies chest pain, shortness of breath on exertion or dyspnea. She is working weight loss to help control her blood pressure with the goal of decreasing her risk of heart attack and stroke. [name]'s blood pressure is currently stable.  ALLERGIES: Allergies  Allergen Reactions  . Morphine And Related Nausea And Vomiting    MEDICATIONS: Current Outpatient Prescriptions on File Prior to Visit  Medication Sig Dispense Refill  . aspirin 81 MG tablet Take 81 mg by mouth daily.    . furosemide (LASIX) 20 MG tablet Take 1 tablet (20 mg total) by mouth daily. 90 tablet 3  . lisinopril (PRINIVIL,ZESTRIL) 10 MG tablet Take 1 tablet (10 mg total) by mouth daily. 90 tablet 3  . lovastatin (MEVACOR) 40 MG tablet Take 1 tablet (40 mg total) by mouth at bedtime. 90 tablet 3  . meloxicam (MOBIC) 7.5 MG tablet Take 7.5 mg by mouth 2 (two) times daily.    . metoprolol succinate (TOPROL-XL) 25 MG 24 hr tablet Take 0.5 tablets (12.5 mg total) by mouth daily. 45 tablet 3  . Multiple  Vitamins-Minerals (WOMENS MULTI VITAMIN & MINERAL) TABS Take 1 tablet by mouth daily.    . Omega-3 Fatty Acids (FISH OIL) 1200 MG CAPS Take 1,200 mg by mouth 2 (two) times daily. TOTAL OF 2,400 MG DAILY    . omeprazole (PRILOSEC) 20 MG capsule Take 20 mg by mouth daily.    Marland Kitchen zolpidem (AMBIEN) 10 MG tablet Take 5 mg by mouth at bedtime as needed for sleep.     . [DISCONTINUED] Omeprazole (PRILOSEC PO) Take 20 mg by mouth daily.      No current facility-administered medications on file prior to visit.     PAST MEDICAL HISTORY: Past Medical History:  Diagnosis Date  . Chronic diastolic CHF (congestive heart failure) (HCC)   . Constipation   . Diastolic dysfunction   . Gastric reflux   . GERD (gastroesophageal reflux disease)   . Hyperlipemia    dyslipidemia  . Hypertension   . Knee pain    bilateral  . Obesity   . Shoulder pain    right shoulder  . SOB (shortness of breath) on exertion   . Swelling    feet and legs    PAST SURGICAL HISTORY: Past Surgical History:  Procedure Laterality Date  . ABDOMINAL HYSTERECTOMY    . APPENDECTOMY  1977  . CARDIAC CATHETERIZATION  02/2009   no evidence of CAD/ Elevated LVEDP at the time  ofcath c/w diastolic dysfunction  . KNEE SURGERY  1986  . NECK SURGERY    . OOPHORECTOMY  1972  . SHOULDER ARTHROSCOPY      SOCIAL HISTORY: Social History  Substance Use Topics  . Smoking status: Never Smoker  . Smokeless tobacco: Never Used  . Alcohol use No    FAMILY HISTORY: Family History  Problem Relation Age of Onset  . Heart failure Mother   . Diabetes Mother   . Hypertension Mother   . Hyperlipidemia Mother   . Stroke Mother   . Heart disease Mother   . CAD Father   . Heart attack Father   . Hypertension Father   . Hyperlipidemia Father   . Heart disease Father   . Sudden death Father   . CAD Sister   . CAD Brother   . Pancreatic cancer Brother   . CAD Brother   . CAD Brother   . Heart failure Sister   . Other Brother         OPEN HEART  . Other Sister        OPEN HEART    ROS: Review of Systems  Constitutional: Positive for weight loss.  Respiratory: Negative for shortness of breath.        Negative shortness of breath on exertion  Cardiovascular: Negative for chest pain.  Gastrointestinal: Negative for nausea and vomiting.  Musculoskeletal:       Negative muscle weakness    PHYSICAL EXAM: Blood pressure 130/70, pulse (!) 52, temperature 98.2 F (36.8 C), temperature source Oral, height 5\' 2"  (1.575 m), weight 178 lb (80.7 kg), SpO2 98 %. Body mass index is 32.56 kg/m. Physical Exam  Constitutional: She is oriented to person, place, and time. She appears well-developed and well-nourished.  Cardiovascular: Normal rate.   Pulmonary/Chest: Effort normal.  Musculoskeletal: Normal range of motion.  Neurological: She is oriented to person, place, and time.  Skin: Skin is warm and dry.  Psychiatric: She has a normal mood and affect. Her behavior is normal.  Vitals reviewed.   RECENT LABS AND TESTS: BMET    Component Value Date/Time   NA 138 01/27/2017 1054   K 4.6 01/27/2017 1054   CL 98 01/27/2017 1054   CO2 24 01/27/2017 1054   GLUCOSE 95 01/27/2017 1054   GLUCOSE 104 (H) 03/18/2016 1000   BUN 29 (H) 01/27/2017 1054   CREATININE 0.66 01/27/2017 1054   CREATININE 0.70 03/18/2016 1000   CALCIUM 9.6 01/27/2017 1054   GFRNONAA 88 01/27/2017 1054   GFRAA 101 01/27/2017 1054   Lab Results  Component Value Date   HGBA1C 5.7 (H) 01/27/2017   Lab Results  Component Value Date   INSULIN 16.7 01/27/2017   CBC    Component Value Date/Time   WBC 7.3 01/27/2017 1040   WBC 10.0 04/22/2011 1439   RBC 4.31 01/27/2017 1040   RBC 4.08 04/22/2011 1439   HGB 13.4 01/27/2017 1040   HCT 41.1 01/27/2017 1040   PLT 231 01/27/2017 1040   MCV 95 01/27/2017 1040   MCH 31.1 01/27/2017 1040   MCH 31.9 04/22/2011 1439   MCHC 32.6 01/27/2017 1040   MCHC 34.6 04/22/2011 1439   RDW 13.3 01/27/2017 1040    LYMPHSABS 2.1 01/27/2017 1040   MONOABS 0.5 04/22/2011 1439   EOSABS 0.1 01/27/2017 1040   BASOSABS 0.0 01/27/2017 1040   Iron/TIBC/Ferritin/ %Sat No results found for: IRON, TIBC, FERRITIN, IRONPCTSAT Lipid Panel     Component Value Date/Time  CHOL 174 01/27/2017 1040   TRIG 105 01/27/2017 1040   HDL 46 01/27/2017 1040   CHOLHDL 4.5 03/18/2016 1000   VLDL 25 03/18/2016 1000   LDLCALC 107 (H) 01/27/2017 1040   Hepatic Function Panel     Component Value Date/Time   PROT 7.1 01/27/2017 1054   ALBUMIN 4.8 01/27/2017 1054   AST 45 (H) 01/27/2017 1054   ALT 56 (H) 01/27/2017 1054   ALKPHOS 100 01/27/2017 1054   BILITOT 0.6 01/27/2017 1054   BILIDIR 0.1 02/07/2015 1521      Component Value Date/Time   TSH 1.710 01/27/2017 1040    ASSESSMENT AND PLAN: Vitamin D deficiency - Plan: Vitamin D, Ergocalciferol, (DRISDOL) 50000 units CAPS capsule  Essential hypertension  Class 1 obesity with serious comorbidity and body mass index (BMI) of 32.0 to 32.9 in adult, unspecified obesity type  PLAN:  Vitamin D Deficiency [name] was informed that low vitamin D levels contributes to fatigue and are associated with obesity, breast, and colon cancer. She agreed to continue taking prescription Vit D @50 ,000 IU every week and will follow up for routine testing of vitamin D, at least 2-3 times per year.  She was informed of the risk of over-replacement of vitamin D and agrees to not increase her dose unless he discusses this with [place] first. We will refill Vit D 50,000 #4 and [name] agreed to follow up with our clinic in 3 weeks.   Hypertension We discussed sodium restriction, working on healthy weight loss, and a regular exercise program as the means to achieve improved blood pressure control. [name] agreed with this plan and agreed to follow up as directed. We will continue to monitor her blood pressure as well as her progress with the above lifestyle modifications. She will continue her  medications as prescribed and will watch for signs of hypotension as she continues her lifestyle modifications.  Obesity [name] is currently in the action stage of change. As such, her goal is to continue with weight loss efforts She has agreed to keep a food journal with 400-500 calories and 40+ protein and follow the Category 2 plan daily [name] has been instructed to work up to a goal of 150 minutes of combined cardio and strengthening exercise per week for weight loss and overall health benefits. We discussed the following Behavioral Modification Strategies today: increasing lean protein intake and meal planning & cooking strategies    [name] has agreed to follow up with our clinic in 3 weeks. She was informed of the importance of frequent follow up visits to maximize her success with intensive lifestyle modifications for her multiple health conditions.  I, [name], am acting as transcriptionist for Jill Level, PA-C  I have reviewed the above documentation for accuracy and completeness, and I agree with the above. -Jill Level, PA-C  I have reviewed the above note and agree with the plan. -Quillian Quince, MD   OBESITY BEHAVIORAL INTERVENTION VISIT  Today's visit was # 7 out of 22.  Starting weight: 202 lbs Starting date: 01/27/17 Today's weight : 178 lbs  Today's date: 05/06/17 Total lbs lost to date: 24 (Patients must lose 7 lbs in the first 6 months to continue with counseling)   ASK: We discussed the diagnosis of obesity with Pilar Jarvis Pelfrey today and Athalee agreed to give [place] permission to discuss obesity behavioral modification therapy today.  ASSESS: Melenda has the diagnosis of obesity and her BMI today is 44 Tayonna is in the action stage of  change   ADVISE: Lynn was educated on the multiple health risks of obesity as well as the benefit of weight loss to improve her health. She was advised of the need for long term treatment and the importance of lifestyle  modifications.  AGREE: Multiple dietary modification options and treatment options were discussed and Dayanne agreed to keep a food journal with 400-500 calories and 40+ protein and follow the Category 2 plan daily We discussed the following Behavioral Modification Strategies today: increasing lean protein intake and meal planning & cooking strategies

## 2017-05-26 ENCOUNTER — Ambulatory Visit (INDEPENDENT_AMBULATORY_CARE_PROVIDER_SITE_OTHER): Payer: Medicare Other | Admitting: Physician Assistant

## 2017-05-26 VITALS — BP 107/69 | HR 51 | Temp 98.0°F | Ht 62.0 in | Wt 173.0 lb

## 2017-05-26 DIAGNOSIS — R001 Bradycardia, unspecified: Secondary | ICD-10-CM

## 2017-05-26 DIAGNOSIS — E784 Other hyperlipidemia: Secondary | ICD-10-CM | POA: Diagnosis not present

## 2017-05-26 DIAGNOSIS — E669 Obesity, unspecified: Secondary | ICD-10-CM

## 2017-05-26 DIAGNOSIS — I10 Essential (primary) hypertension: Secondary | ICD-10-CM

## 2017-05-26 DIAGNOSIS — Z6831 Body mass index (BMI) 31.0-31.9, adult: Secondary | ICD-10-CM | POA: Diagnosis not present

## 2017-05-26 DIAGNOSIS — R7303 Prediabetes: Secondary | ICD-10-CM | POA: Diagnosis not present

## 2017-05-26 DIAGNOSIS — E559 Vitamin D deficiency, unspecified: Secondary | ICD-10-CM

## 2017-05-26 DIAGNOSIS — E7849 Other hyperlipidemia: Secondary | ICD-10-CM

## 2017-05-26 MED ORDER — VITAMIN D (ERGOCALCIFEROL) 1.25 MG (50000 UNIT) PO CAPS: 50000.0000 [IU] | ORAL_CAPSULE | ORAL | 0 refills | Status: DC

## 2017-05-26 NOTE — Progress Notes (Signed)
Office: [phone]  /  Fax: [phone]   HPI:   Chief Complaint: OBESITY [name] is here to discuss her progress with her obesity treatment plan. She is on the Category 2 plan and is following her eating plan approximately 100 % of the time. She states she is walking for 26 minutes daily. [name] continues to do well with weight loss. She has been exercising more and has been very motivated to loose weight. She plans her meals well and includes variety, and so feels she is well with the structure meal plan rather than keeping a food journal. Her weight is 173 lb (78.5 kg) today and has had a weight loss of 5 pounds over a period of 3 weeks since her last visit. She has lost 29 lbs since starting treatment with [place].  Vitamin D deficiency [name] has a diagnosis of vitamin D deficiency. She is currently taking vit D and denies nausea, vomiting or muscle weakness.  Hypertension [name] is a 76 y.o. female with hypertension. [name] denies chest pain or shortness of breath on exertion. She is working weight loss to help control her blood pressure with the goal of decreasing her risk of heart attack and stroke. [name] blood pressure is currently controlled.  Hyperlipidemia [name] has hyperlipidemia and has been trying to improve her cholesterol levels with intensive lifestyle modification including a low saturated fat diet, exercise and weight loss. She is currently taking a statin and denies any chest pain, claudication or myalgias.  Bradycardia [name] has a history of CHF, follows up with Dr. Mayford Knife, currently on ASA, Lisinopril, Lasix, Lovastatin, and Metoprolol 12.5mg .  Her heart rate is in the 50's most days, however, she is asymptomatic, and denies any lightheadedness, dizziness, or any excessive fatigue.   Pre-Diabetes [name] has a diagnosis of pre-diabetes based on her elevated Hgb A1c and was informed this puts her at greater risk of developing diabetes. She is not  taking metformin currently and continues to work on diet and exercise to decrease risk of diabetes. She denies nausea or hypoglycemia.  ALLERGIES: Allergies  Allergen Reactions  . Morphine And Related Nausea And Vomiting    MEDICATIONS: Current Outpatient Prescriptions on File Prior to Visit  Medication Sig Dispense Refill  . aspirin 81 MG tablet Take 81 mg by mouth daily.    . furosemide (LASIX) 20 MG tablet Take 1 tablet (20 mg total) by mouth daily. 90 tablet 3  . lisinopril (PRINIVIL,ZESTRIL) 10 MG tablet Take 1 tablet (10 mg total) by mouth daily. 90 tablet 3  . lovastatin (MEVACOR) 40 MG tablet Take 1 tablet (40 mg total) by mouth at bedtime. 90 tablet 3  . meloxicam (MOBIC) 7.5 MG tablet Take 7.5 mg by mouth 2 (two) times daily.    . metoprolol succinate (TOPROL-XL) 25 MG 24 hr tablet Take 0.5 tablets (12.5 mg total) by mouth daily. 45 tablet 3  . Multiple Vitamins-Minerals (WOMENS MULTI VITAMIN & MINERAL) TABS Take 1 tablet by mouth daily.    . Omega-3 Fatty Acids (FISH OIL) 1200 MG CAPS Take 1,200 mg by mouth 2 (two) times daily. TOTAL OF 2,400 MG DAILY    . omeprazole (PRILOSEC) 20 MG capsule Take 20 mg by mouth daily.    Marland Kitchen zolpidem (AMBIEN) 10 MG tablet Take 5 mg by mouth at bedtime as needed for sleep.     . [DISCONTINUED] Omeprazole (PRILOSEC PO) Take 20 mg by mouth daily.      No current facility-administered medications  on file prior to visit.     PAST MEDICAL HISTORY: Past Medical History:  Diagnosis Date  . Chronic diastolic CHF (congestive heart failure) (HCC)   . Constipation   . Diastolic dysfunction   . Gastric reflux   . GERD (gastroesophageal reflux disease)   . Hyperlipemia    dyslipidemia  . Hypertension   . Knee pain    bilateral  . Obesity   . Shoulder pain    right shoulder  . SOB (shortness of breath) on exertion   . Swelling    feet and legs    PAST SURGICAL HISTORY: Past Surgical History:  Procedure Laterality Date  . ABDOMINAL  HYSTERECTOMY    . APPENDECTOMY  1977  . CARDIAC CATHETERIZATION  02/2009   no evidence of CAD/ Elevated LVEDP at the time ofcath c/w diastolic dysfunction  . KNEE SURGERY  1986  . NECK SURGERY    . OOPHORECTOMY  1972  . SHOULDER ARTHROSCOPY      SOCIAL HISTORY: Social History  Substance Use Topics  . Smoking status: Never Smoker  . Smokeless tobacco: Never Used  . Alcohol use No    FAMILY HISTORY: Family History  Problem Relation Age of Onset  . Heart failure Mother   . Diabetes Mother   . Hypertension Mother   . Hyperlipidemia Mother   . Stroke Mother   . Heart disease Mother   . CAD Father   . Heart attack Father   . Hypertension Father   . Hyperlipidemia Father   . Heart disease Father   . Sudden death Father   . CAD Sister   . CAD Brother   . Pancreatic cancer Brother   . CAD Brother   . CAD Brother   . Heart failure Sister   . Other Brother        OPEN HEART  . Other Sister        OPEN HEART    ROS: Review of Systems  Constitutional: Positive for weight loss.  Respiratory: Negative for shortness of breath (on exertion).   Cardiovascular: Negative for chest pain and claudication.  Gastrointestinal: Negative for nausea and vomiting.  Musculoskeletal: Negative for myalgias.       Negative muscle weakness  Endo/Heme/Allergies:       Negative hypoglycemia    PHYSICAL EXAM: Blood pressure 107/69, pulse (!) 51, temperature 98 F (36.7 C), temperature source Oral, height  (1.575 m), weight 173 lb (78.5 kg), SpO2 96 %. Body mass index is 31.64 kg/m. Physical Exam  Constitutional: She is oriented to person, place, and time. She appears well-developed and well-nourished.  Cardiovascular: Bradycardia present.   Pulmonary/Chest: Effort normal.  Musculoskeletal: Normal range of motion.  Neurological: She is oriented to person, place, and time.  Skin: Skin is warm and dry.  Psychiatric: She has a normal mood and affect. Her behavior is normal.  Vitals  reviewed.   RECENT LABS AND TESTS: BMET    Component Value Date/Time   NA 138 01/27/2017 1054   K 4.6 01/27/2017 1054   CL 98 01/27/2017 1054   CO2 24 01/27/2017 1054   GLUCOSE 95 01/27/2017 1054   GLUCOSE 104 (H) 03/18/2016 1000   BUN 29 (H) 01/27/2017 1054   CREATININE 0.66 01/27/2017 1054   CREATININE 0.70 03/18/2016 1000   CALCIUM 9.6 01/27/2017 1054   GFRNONAA 88 01/27/2017 1054   GFRAA 101 01/27/2017 1054   Lab Results  Component Value Date   HGBA1C 5.7 (H) 01/27/2017  Lab Results  Component Value Date   INSULIN 16.7 01/27/2017   CBC    Component Value Date/Time   WBC 7.3 01/27/2017 1040   WBC 10.0 04/22/2011 1439   RBC 4.31 01/27/2017 1040   RBC 4.08 04/22/2011 1439   HGB 13.4 01/27/2017 1040   HCT 41.1 01/27/2017 1040   PLT 231 01/27/2017 1040   MCV 95 01/27/2017 1040   MCH 31.1 01/27/2017 1040   MCH 31.9 04/22/2011 1439   MCHC 32.6 01/27/2017 1040   MCHC 34.6 04/22/2011 1439   RDW 13.3 01/27/2017 1040   LYMPHSABS 2.1 01/27/2017 1040   MONOABS 0.5 04/22/2011 1439   EOSABS 0.1 01/27/2017 1040   BASOSABS 0.0 01/27/2017 1040   Iron/TIBC/Ferritin/ %Sat No results found for: IRON, TIBC, FERRITIN, IRONPCTSAT Lipid Panel     Component Value Date/Time   CHOL 174 01/27/2017 1040   TRIG 105 01/27/2017 1040   HDL 46 01/27/2017 1040   CHOLHDL 4.5 03/18/2016 1000   VLDL 25 03/18/2016 1000   LDLCALC 107 (H) 01/27/2017 1040   Hepatic Function Panel     Component Value Date/Time   PROT 7.1 01/27/2017 1054   ALBUMIN 4.8 01/27/2017 1054   AST 45 (H) 01/27/2017 1054   ALT 56 (H) 01/27/2017 1054   ALKPHOS 100 01/27/2017 1054   BILITOT 0.6 01/27/2017 1054   BILIDIR 0.1 02/07/2015 1521      Component Value Date/Time   TSH 1.710 01/27/2017 1040    ASSESSMENT AND PLAN: Vitamin D deficiency - Plan: VITAMIN D 25 Hydroxy (Vit-D Deficiency, Fractures), Vitamin D, Ergocalciferol, (DRISDOL) 50000 units CAPS capsule  Essential hypertension  Other  hyperlipidemia - Plan: Lipid Panel With LDL/HDL Ratio  Prediabetes - Plan: Comprehensive metabolic panel, Hemoglobin A1c, Insulin, random  Bradycardia  Class 1 obesity with serious comorbidity and body mass index (BMI) of 31.0 to 31.9 in adult, unspecified obesity type  PLAN:  Vitamin D Deficiency Ayleah was informed that low vitamin D levels contributes to fatigue and are associated with obesity, breast, and colon cancer. She agrees to continue to take prescription Vit D ,000 IU every week, we will refill for 1 month and will check labs and will follow up for routine testing of vitamin D, at least 2-3 times per year. She was informed of the risk of over-replacement of vitamin D and agrees to not increase her dose unless he discusses this with [place] first. Kashira agrees to follow up with our clinic in 3 weeks.  Hypertension We discussed sodium restriction, working on healthy weight loss, and a regular exercise program as the means to achieve improved blood pressure control. Masae agreed with this plan and agreed to follow up as directed. We will check labs and will continue to monitor her blood pressure as well as her progress with the above lifestyle modifications. She will continue her medications as prescribed and will watch for signs of hypotension as she continues her lifestyle modifications.  Hyperlipidemia Krystyne was informed of the American Heart Association Guidelines emphasizing intensive lifestyle modifications as the first line treatment for hyperlipidemia. We discussed many lifestyle modifications today in depth, and Remmie will continue to work on decreasing saturated fats such as fatty red meat, butter and many fried foods. She will also increase vegetables and lean protein in her diet and continue to work on exercise and weight loss efforts. We will check labs and she will continue her medications as prescribed.   Bradycardia Jillienne will continue her medications as prescribed and will  follow up with her cardiologist in approximately 1 year. Shya agrees to follow up with our clinic in 3 weeks.  Pre-Diabetes Lirio will continue to work on weight loss, exercise, and decreasing simple carbohydrates in her diet to help decrease the risk of diabetes. We dicussed metformin including benefits and risks. She was informed that eating too many simple carbohydrates or too many calories at one sitting increases the likelihood of GI side effects. Kinesha declined metformin for now and a prescription was not written today. Izabel agreed to follow up with [place] as directed to monitor her progress.  Obesity Aylani is currently in the action stage of change. As such, her goal is to continue with weight loss efforts She has agreed to follow the Category 2 plan Indy has been instructed to work up to a goal of 150 minutes of combined cardio and strengthening exercise per week for weight loss and overall health benefits. We discussed the following Behavioral Modification Strategies today: increasing lean protein intake and work on meal planning and easy cooking plans  Gianni has agreed to follow up with our clinic in 3 weeks. She was informed of the importance of frequent follow up visits to maximize her success with intensive lifestyle modifications for her multiple health conditions.  I, [place] Crane, am acting as transcriptionist for Illa Level, PA-C  I have reviewed the above documentation for accuracy and completeness, and I agree with the above. -Illa Level, PA-C  I have reviewed the above note and agree with the plan. -Quillian Quince, MD   OBESITY BEHAVIORAL INTERVENTION VISIT  Today's visit was # 8 out of 22.  Starting weight: 202 lbs Starting date: 01/27/17 Today's weight : 173 lbs  Today's date: 05/26/2017 Total lbs lost to date: 29 (Patients must lose 7 lbs in the first 6 months to continue with counseling)   ASK: We discussed the diagnosis of obesity with Pilar Jarvis Spillane  today and Jnyah agreed to give [place] permission to discuss obesity behavioral modification therapy today.  ASSESS: Chandrika has the diagnosis of obesity and her BMI today is 31.63 Blimi is in the action stage of change   ADVISE: Kitt was educated on the multiple health risks of obesity as well as the benefit of weight loss to improve her health. She was advised of the need for long term treatment and the importance of lifestyle modifications.  AGREE: Multiple dietary modification options and treatment options were discussed and  Omesha agreed to follow the Category 2 plan We discussed the following Behavioral Modification Strategies today: increasing lean protein intake and work on meal planning and easy cooking plans

## 2017-05-27 ENCOUNTER — Telehealth (INDEPENDENT_AMBULATORY_CARE_PROVIDER_SITE_OTHER): Payer: Self-pay | Admitting: Physician Assistant

## 2017-05-27 ENCOUNTER — Encounter (INDEPENDENT_AMBULATORY_CARE_PROVIDER_SITE_OTHER): Payer: Self-pay

## 2017-05-27 LAB — COMPREHENSIVE METABOLIC PANEL
ALT: 25 IU/L (ref 0–32)
AST: 26 IU/L (ref 0–40)
Albumin/Globulin Ratio: 2.3 — ABNORMAL HIGH (ref 1.2–2.2)
Albumin: 4.6 g/dL (ref 3.5–4.8)
Alkaline Phosphatase: 86 IU/L (ref 39–117)
BILIRUBIN TOTAL: 0.5 mg/dL (ref 0.0–1.2)
BUN/Creatinine Ratio: 38 — ABNORMAL HIGH (ref 12–28)
BUN: 27 mg/dL (ref 8–27)
CALCIUM: 9.8 mg/dL (ref 8.7–10.3)
CHLORIDE: 103 mmol/L (ref 96–106)
CO2: 25 mmol/L (ref 20–29)
Creatinine, Ser: 0.71 mg/dL (ref 0.57–1.00)
GFR, EST AFRICAN AMERICAN: 98 mL/min/{1.73_m2} (ref >59)
GFR, EST NON AFRICAN AMERICAN: 85 mL/min/{1.73_m2} (ref >59)
GLOBULIN, TOTAL: 2 g/dL (ref 1.5–4.5)
Glucose: 100 mg/dL — ABNORMAL HIGH (ref 65–99)
POTASSIUM: 4.5 mmol/L (ref 3.5–5.2)
SODIUM: 142 mmol/L (ref 134–144)
TOTAL PROTEIN: 6.6 g/dL (ref 6.0–8.5)

## 2017-05-27 LAB — LIPID PANEL WITH LDL/HDL RATIO
Cholesterol, Total: 133 mg/dL (ref 100–199)
HDL: 35 mg/dL — AB (ref >39)
LDL CALC: 69 mg/dL (ref 0–99)
LDL/HDL RATIO: 2 ratio (ref 0.0–3.2)
TRIGLYCERIDES: 147 mg/dL (ref 0–149)
VLDL Cholesterol Cal: 29 mg/dL (ref 5–40)

## 2017-05-27 LAB — INSULIN, RANDOM: INSULIN: 13.1 u[IU]/mL (ref 2.6–24.9)

## 2017-05-27 LAB — VITAMIN D 25 HYDROXY (VIT D DEFICIENCY, FRACTURES): Vit D, 25-Hydroxy: 61.7 ng/mL (ref 30.0–100.0)

## 2017-05-27 LAB — HEMOGLOBIN A1C
Est. average glucose Bld gHb Est-mCnc: 108 mg/dL
Hgb A1c MFr Bld: 5.4 % (ref 4.8–5.6)

## 2017-05-27 NOTE — Telephone Encounter (Signed)
Called [facility] in [place] and they have the prescription for the pt.  We will send a mychart message to patient notifying her of the same.   Thanks Keysean Savino

## 2017-05-27 NOTE — Telephone Encounter (Signed)
PATIENT REQUESTED THAT HER RX FOR VITAMINS GO TO [facility] IN [place].  IT WAS SENT TO INCORRECT PHARMACY.

## 2017-05-29 ENCOUNTER — Encounter (INDEPENDENT_AMBULATORY_CARE_PROVIDER_SITE_OTHER): Payer: Self-pay | Admitting: Physician Assistant

## 2017-05-30 ENCOUNTER — Encounter (INDEPENDENT_AMBULATORY_CARE_PROVIDER_SITE_OTHER): Payer: Self-pay | Admitting: Physician Assistant

## 2017-06-04 DIAGNOSIS — M5136 Other intervertebral disc degeneration, lumbar region: Secondary | ICD-10-CM | POA: Diagnosis not present

## 2017-06-04 DIAGNOSIS — M5137 Other intervertebral disc degeneration, lumbosacral region: Secondary | ICD-10-CM | POA: Diagnosis not present

## 2017-06-04 DIAGNOSIS — M9904 Segmental and somatic dysfunction of sacral region: Secondary | ICD-10-CM | POA: Diagnosis not present

## 2017-06-04 DIAGNOSIS — M9903 Segmental and somatic dysfunction of lumbar region: Secondary | ICD-10-CM | POA: Diagnosis not present

## 2017-06-16 ENCOUNTER — Ambulatory Visit (INDEPENDENT_AMBULATORY_CARE_PROVIDER_SITE_OTHER): Payer: Medicare Other | Admitting: Physician Assistant

## 2017-06-17 ENCOUNTER — Ambulatory Visit (INDEPENDENT_AMBULATORY_CARE_PROVIDER_SITE_OTHER): Payer: Medicare Other | Admitting: Physician Assistant

## 2017-06-17 VITALS — BP 105/67 | HR 57 | Temp 97.9°F | Ht 62.0 in | Wt 171.0 lb

## 2017-06-17 DIAGNOSIS — E559 Vitamin D deficiency, unspecified: Secondary | ICD-10-CM

## 2017-06-17 DIAGNOSIS — E669 Obesity, unspecified: Secondary | ICD-10-CM | POA: Diagnosis not present

## 2017-06-17 DIAGNOSIS — Z6831 Body mass index (BMI) 31.0-31.9, adult: Secondary | ICD-10-CM | POA: Diagnosis not present

## 2017-06-17 DIAGNOSIS — I10 Essential (primary) hypertension: Secondary | ICD-10-CM

## 2017-06-17 MED ORDER — VITAMIN D (ERGOCALCIFEROL) 1.25 MG (50000 UNIT) PO CAPS: 50000.0000 [IU] | ORAL_CAPSULE | ORAL | 0 refills | Status: DC

## 2017-06-18 ENCOUNTER — Telehealth: Payer: Self-pay | Admitting: Cardiology

## 2017-06-18 DIAGNOSIS — Z23 Encounter for immunization: Secondary | ICD-10-CM | POA: Diagnosis not present

## 2017-06-18 NOTE — Telephone Encounter (Signed)
New message    Pt is calling asking for a call back. She said it is about her BP medications.

## 2017-06-18 NOTE — Progress Notes (Signed)
Office: [phone]  /  Fax: [phone]   HPI:   Chief Complaint: OBESITY [name] is here to discuss [name] progress with [name] obesity treatment plan. [name] is on the Category 2 plan and is following [name] eating plan approximately 95 % of the time. [name] states [name] is walking for 30 minutes 7 times per week. [name] continues to do well with weight loss. [name] was on vacation but managed to follow the plan. [name] is making smarter food choices and is controlling [name] portions. [name] weight is 171 lb (77.6 kg) today and has had a weight loss of 2 pounds over a period of 3 weeks since [name] last visit. [name] has lost 31 lbs since starting treatment with [place].  Vitamin D deficiency [name] has a diagnosis of vitamin D deficiency. [name] is currently taking vit D and denies nausea, vomiting or muscle weakness.  Hypertension [name] is a 76 y.o. female with hypertension. [name] states blood pressures are low at home with systolic blood pressures in the 100's.  [name] denies chest pain or shortness of breath on exertion. [name] is working weight loss to help control [name] blood pressure with the goal of decreasing [name] risk of heart attack and stroke. [name] blood pressure is currently stable.    ALLERGIES: Allergies  Allergen Reactions  . Morphine And Related Nausea And Vomiting    MEDICATIONS: Current Outpatient Prescriptions on File Prior to Visit  Medication Sig Dispense Refill  . aspirin 81 MG tablet Take 81 mg by mouth daily.    . furosemide (LASIX) 20 MG tablet Take 1 tablet (20 mg total) by mouth daily. 90 tablet 3  . lisinopril (PRINIVIL,ZESTRIL) 10 MG tablet Take 1 tablet (10 mg total) by mouth daily. (Patient taking differently: Take 5 mg by mouth daily. ) 90 tablet 3  . lovastatin (MEVACOR) 40 MG tablet Take 1 tablet (40 mg total) by mouth at bedtime. 90 tablet 3  . meloxicam (MOBIC) 7.5 MG tablet Take 7.5 mg by mouth 2 (two) times daily.    . Multiple Vitamins-Minerals (WOMENS MULTI  VITAMIN & MINERAL) TABS Take 1 tablet by mouth daily.    . Omega-3 Fatty Acids (FISH OIL) 1200 MG CAPS Take 1,200 mg by mouth 2 (two) times daily. TOTAL OF 2,400 MG DAILY    . omeprazole (PRILOSEC) 20 MG capsule Take 20 mg by mouth daily.    Marland Kitchen zolpidem (AMBIEN) 10 MG tablet Take 5 mg by mouth at bedtime as needed for sleep.     . [DISCONTINUED] Omeprazole (PRILOSEC PO) Take 20 mg by mouth daily.      No current facility-administered medications on file prior to visit.     PAST MEDICAL HISTORY: Past Medical History:  Diagnosis Date  . Chronic diastolic CHF (congestive heart failure) (HCC)   . Constipation   . Diastolic dysfunction   . Gastric reflux   . GERD (gastroesophageal reflux disease)   . Hyperlipemia    dyslipidemia  . Hypertension   . Knee pain    bilateral  . Obesity   . Shoulder pain    right shoulder  . SOB (shortness of breath) on exertion   . Swelling    feet and legs    PAST SURGICAL HISTORY: Past Surgical History:  Procedure Laterality Date  . ABDOMINAL HYSTERECTOMY    . APPENDECTOMY  1977  . CARDIAC CATHETERIZATION  02/2009   no evidence of CAD/ Elevated LVEDP at the time ofcath c/w diastolic dysfunction  .  KNEE SURGERY  1986  . NECK SURGERY    . OOPHORECTOMY  1972  . SHOULDER ARTHROSCOPY      SOCIAL HISTORY: Social History  Substance Use Topics  . Smoking status: Never Smoker  . Smokeless tobacco: Never Used  . Alcohol use No    FAMILY HISTORY: Family History  Problem Relation Age of Onset  . Heart failure Mother   . Diabetes Mother   . Hypertension Mother   . Hyperlipidemia Mother   . Stroke Mother   . Heart disease Mother   . CAD Father   . Heart attack Father   . Hypertension Father   . Hyperlipidemia Father   . Heart disease Father   . Sudden death Father   . CAD Sister   . CAD Brother   . Pancreatic cancer Brother   . CAD Brother   . CAD Brother   . Heart failure Sister   . Other Brother        OPEN HEART  . Other Sister          OPEN HEART    ROS: Review of Systems  Constitutional: Positive for weight loss.  Gastrointestinal: Negative for nausea and vomiting.  Musculoskeletal:       Negative muscle weakness    PHYSICAL EXAM: Blood pressure 105/67, pulse (!) 57, temperature 97.9 F (36.6 C), temperature source Oral, height  (1.575 m), weight 171 lb (77.6 kg), SpO2 98 %. Body mass index is 31.28 kg/m. Physical Exam  Constitutional: [name] is oriented to person, place, and time. [name] appears well-developed and well-nourished.  Cardiovascular:  Bradycardic  Pulmonary/Chest: Effort normal.  Musculoskeletal: Normal range of motion.  Neurological: [name] is oriented to person, place, and time.  Skin: Skin is warm and dry.  Psychiatric: [name] has a normal mood and affect. [name] behavior is normal.  Vitals reviewed.   RECENT LABS AND TESTS: BMET    Component Value Date/Time   NA 142 05/26/2017 0939   K 4.5 05/26/2017 0939   CL 103 05/26/2017 0939   CO2 25 05/26/2017 0939   GLUCOSE 100 (H) 05/26/2017 0939   GLUCOSE 104 (H) 03/18/2016 1000   BUN 27 05/26/2017 0939   CREATININE 0.71 05/26/2017 0939   CREATININE 0.70 03/18/2016 1000   CALCIUM 9.8 05/26/2017 0939   GFRNONAA 85 05/26/2017 0939   GFRAA 98 05/26/2017 0939   Lab Results  Component Value Date   HGBA1C 5.4 05/26/2017   HGBA1C 5.7 (H) 01/27/2017   Lab Results  Component Value Date   INSULIN 13.1 05/26/2017   INSULIN 16.7 01/27/2017   CBC    Component Value Date/Time   WBC 7.3 01/27/2017 1040   WBC 10.0 04/22/2011 1439   RBC 4.31 01/27/2017 1040   RBC 4.08 04/22/2011 1439   HGB 13.4 01/27/2017 1040   HCT 41.1 01/27/2017 1040   PLT 231 01/27/2017 1040   MCV 95 01/27/2017 1040   MCH 31.1 01/27/2017 1040   MCH 31.9 04/22/2011 1439   MCHC 32.6 01/27/2017 1040   MCHC 34.6 04/22/2011 1439   RDW 13.3 01/27/2017 1040   LYMPHSABS 2.1 01/27/2017 1040   MONOABS 0.5 04/22/2011 1439   EOSABS 0.1 01/27/2017 1040   BASOSABS 0.0  01/27/2017 1040   Iron/TIBC/Ferritin/ %Sat No results found for: IRON, TIBC, FERRITIN, IRONPCTSAT Lipid Panel     Component Value Date/Time   CHOL 133 05/26/2017 0939   TRIG 147 05/26/2017 0939   HDL 35 (L) 05/26/2017 0939   CHOLHDL 4.5  03/18/2016 1000   VLDL 25 03/18/2016 1000   LDLCALC 69 05/26/2017 0939   Hepatic Function Panel     Component Value Date/Time   PROT 6.6 05/26/2017 0939   ALBUMIN 4.6 05/26/2017 0939   AST 26 05/26/2017 0939   ALT 25 05/26/2017 0939   ALKPHOS 86 05/26/2017 0939   BILITOT 0.5 05/26/2017 0939   BILIDIR 0.1 02/07/2015 1521      Component Value Date/Time   TSH 1.710 01/27/2017 1040    ASSESSMENT AND PLAN: Essential hypertension  Vitamin D deficiency - Plan: Vitamin D, Ergocalciferol, (DRISDOL) 50000 units CAPS capsule  Class 1 obesity with serious comorbidity and body mass index (BMI) of 31.0 to 31.9 in adult, unspecified obesity type  PLAN:  Vitamin D Deficiency [name] was informed that low vitamin D levels contributes to fatigue and are associated with obesity, breast, and colon cancer. [name] agrees to continue to take prescription Vit D ,000 IU every week #4 with no refills and will follow up for routine testing of vitamin D, at least 2-3 times per year. [name] was informed of the risk of over-replacement of vitamin D and agrees to not increase [name] dose unless he discusses this with [place] first. [name] agrees to follow up with our clinic in 3 weeks.  Hypertension We discussed sodium restriction, working on healthy weight loss, and a regular exercise program as the means to achieve improved blood pressure control. [name] agreed with this plan and agreed to follow up as directed. We will continue to monitor [name] blood pressure as well as [name] progress with the above lifestyle modifications. [name] agrees to decrease lisinopril to 5 mg (patient will cut pill in half) and will watch for signs of hypotension as [name] continues [name] lifestyle  modifications.  Obesity [name] is currently in the action stage of change. As such, [name] goal is to continue with weight loss efforts [name] has agreed to follow the Category 2 plan [name] has been instructed to work up to a goal of 150 minutes of combined cardio and strengthening exercise per week for weight loss and overall health benefits. We discussed the following Behavioral Modification Strategies today: increasing lean protein intake and work on meal planning and easy cooking plans  [name] has agreed to follow up with our clinic in 3 weeks. [name] was informed of the importance of frequent follow up visits to maximize [name] success with intensive lifestyle modifications for [name] multiple health conditions.  I, [place] Crane, am acting as transcriptionist for Illa Level, PA-C  I have reviewed the above documentation for accuracy and completeness, and I agree with the above. -Illa Level, PA-C  I have reviewed the above note and agree with the plan. -Quillian Quince, MD  OBESITY BEHAVIORAL INTERVENTION VISIT  Today's visit was # 9 out of 22.  Starting weight: 202 lbs Starting date: 01/27/17 Today's weight : 171 lbs Today's date: 06/17/2017 Total lbs lost to date: 72 (Patients must lose 7 lbs in the first 6 months to continue with counseling)   ASK: We discussed the diagnosis of obesity with Pilar Jarvis Intrieri today and Quaniyah agreed to give [place] permission to discuss obesity behavioral modification therapy today.  ASSESS: Teresha has the diagnosis of obesity and [name] BMI today is 31.27 Athalee is in the action stage of change   ADVISE: Gabriela was educated on the multiple health risks of obesity as well as the benefit of weight loss to improve [name] health. [name] was advised of the need for long  term treatment and the importance of lifestyle modifications.  AGREE: Multiple dietary modification options and treatment options were discussed and  Malaiya agreed to follow the Category 2 plan We discussed  the following Behavioral Modification Strategies today: increasing lean protein intake and work on meal planning and easy cooking plans

## 2017-06-18 NOTE — Telephone Encounter (Signed)
Stop Toprol and  Check BP and HR daily for a week and call with results

## 2017-06-18 NOTE — Telephone Encounter (Signed)
Instructed patient to STOP TOPROL. She will check BP and HR daily for a week and call with results. She was grateful for call and agrees with treatment plan.

## 2017-06-18 NOTE — Telephone Encounter (Signed)
Patient called to report she has been going to [facility] and Wellness classes since May and has lost a total of 41 pounds. Her BP now runs about 105/60s. Her LDL has much improved (it is now 74). Labs are in EPIC. Her HR runs in the 50s.  She is calling to ask if she may come off some of her medications. She says she feels fine for the most part, but may feel a little more tired than usual with her BP so low.  Confirmed with patient she is taking Lasix 20 mg daily, Lisinopril 10 mg daily, and Toprol 12.5 mg daily. She also takes lovastatin 40 mg daily.  She understands she will be called back with Dr. Norris Cross recommendations.

## 2017-06-26 ENCOUNTER — Other Ambulatory Visit: Payer: Self-pay | Admitting: Cardiology

## 2017-06-26 MED ORDER — LISINOPRIL 10 MG PO TABS: 10.0000 mg | ORAL_TABLET | Freq: Every day | ORAL | 2 refills | Status: DC

## 2017-06-26 MED ORDER — LOVASTATIN 40 MG PO TABS: 40.0000 mg | ORAL_TABLET | Freq: Every day | ORAL | 2 refills | Status: DC

## 2017-06-30 ENCOUNTER — Other Ambulatory Visit: Payer: Self-pay

## 2017-06-30 ENCOUNTER — Telehealth: Payer: Self-pay | Admitting: Cardiology

## 2017-06-30 MED ORDER — LISINOPRIL 10 MG PO TABS: 10.0000 mg | ORAL_TABLET | Freq: Every day | ORAL | 2 refills | Status: DC

## 2017-06-30 MED ORDER — LOVASTATIN 40 MG PO TABS: 40.0000 mg | ORAL_TABLET | Freq: Every day | ORAL | 2 refills | Status: DC

## 2017-07-07 ENCOUNTER — Telehealth: Payer: Self-pay | Admitting: Cardiology

## 2017-07-07 ENCOUNTER — Ambulatory Visit (INDEPENDENT_AMBULATORY_CARE_PROVIDER_SITE_OTHER): Payer: Medicare Other | Admitting: Physician Assistant

## 2017-07-07 ENCOUNTER — Other Ambulatory Visit: Payer: Self-pay | Admitting: Family Medicine

## 2017-07-07 ENCOUNTER — Other Ambulatory Visit: Payer: Self-pay | Admitting: *Deleted

## 2017-07-07 VITALS — BP 111/70 | HR 54 | Temp 98.0°F | Ht 62.0 in | Wt 166.0 lb

## 2017-07-07 DIAGNOSIS — Z1231 Encounter for screening mammogram for malignant neoplasm of breast: Secondary | ICD-10-CM

## 2017-07-07 DIAGNOSIS — E559 Vitamin D deficiency, unspecified: Secondary | ICD-10-CM

## 2017-07-07 DIAGNOSIS — R7303 Prediabetes: Secondary | ICD-10-CM

## 2017-07-07 DIAGNOSIS — E669 Obesity, unspecified: Secondary | ICD-10-CM

## 2017-07-07 DIAGNOSIS — Z6830 Body mass index (BMI) 30.0-30.9, adult: Secondary | ICD-10-CM | POA: Diagnosis not present

## 2017-07-07 MED ORDER — FUROSEMIDE 20 MG PO TABS: 20.0000 mg | ORAL_TABLET | Freq: Every day | ORAL | 1 refills | Status: DC

## 2017-07-07 MED ORDER — VITAMIN D (ERGOCALCIFEROL) 1.25 MG (50000 UNIT) PO CAPS: 50000.0000 [IU] | ORAL_CAPSULE | ORAL | 0 refills | Status: DC

## 2017-07-07 NOTE — Telephone Encounter (Signed)
°*  STAT* If patient is at the pharmacy, call can be transferred to refill team.   1. Which medications need to be refilled? (please list name of each medication and dose if known) florencimide 20mg  2. Which pharmacy/location (including street and city if local pharmacy) is medication to be sent to? [facility] - [place], [place] - [address]  3. Do they need a 30 day or 90 day supply? 90

## 2017-07-07 NOTE — Telephone Encounter (Signed)
Hold lisinopril for 2 days then resume at 5 mg daily.  Call us BP readings.

## 2017-07-07 NOTE — Progress Notes (Signed)
Office: [phone]  /  Fax: [phone]   HPI:   Chief Complaint: OBESITY [name] is here to discuss her progress with her obesity treatment plan. She is on the Category 2 plan and is following her eating plan approximately 99 % of the time. She states she is exercising with weights and walking for 1 mile daily. [name] continues to do well with weight loss. She continues to be mindful of her eating and controls her portions. She would like to add more variety to her meals. Her weight is 166 lb (75.3 kg) today and has had a weight loss of 5 pounds over a period of 3 weeks since her last visit. She has lost 36 lbs since starting treatment with us.  Vitamin D deficiency [name] has a diagnosis of vitamin D deficiency. She is currently taking vit D and denies nausea, vomiting or muscle weakness.  Pre-Diabetes [name] has a diagnosis of pre-diabetes based on her elevated Hgb A1c and was informed this puts her at greater risk of developing diabetes. She is not taking metformin currently and continues to work on diet and exercise to decrease risk of diabetes. She denies nausea, polyphagia or hypoglycemia.   ALLERGIES: Allergies  Allergen Reactions  . Morphine And Related Nausea And Vomiting    MEDICATIONS: Current Outpatient Prescriptions on File Prior to Visit  Medication Sig Dispense Refill  . aspirin 81 MG tablet Take 81 mg by mouth daily.    . furosemide (LASIX) 20 MG tablet Take 1 tablet (20 mg total) by mouth daily. 90 tablet 3  . lisinopril (PRINIVIL,ZESTRIL) 10 MG tablet Take 1 tablet (10 mg total) by mouth daily. 90 tablet 2  . lovastatin (MEVACOR) 40 MG tablet Take 1 tablet (40 mg total) by mouth at bedtime. 90 tablet 2  . meloxicam (MOBIC) 7.5 MG tablet Take 7.5 mg by mouth 2 (two) times daily.    . Multiple Vitamins-Minerals (WOMENS MULTI VITAMIN & MINERAL) TABS Take 1 tablet by mouth daily.    . Omega-3 Fatty Acids (FISH OIL) 1200 MG CAPS Take 1,200 mg by mouth 2 (two) times  daily. TOTAL OF 2,400 MG DAILY    . omeprazole (PRILOSEC) 20 MG capsule Take 20 mg by mouth daily.    Marland Kitchen. zolpidem (AMBIEN) 10 MG tablet Take 5 mg by mouth at bedtime as needed for sleep.     . [DISCONTINUED] Omeprazole (PRILOSEC PO) Take 20 mg by mouth daily.      No current facility-administered medications on file prior to visit.     PAST MEDICAL HISTORY: Past Medical History:  Diagnosis Date  . Chronic diastolic CHF (congestive heart failure) (HCC)   . Constipation   . Diastolic dysfunction   . Gastric reflux   . GERD (gastroesophageal reflux disease)   . Hyperlipemia    dyslipidemia  . Hypertension   . Knee pain    bilateral  . Obesity   . Shoulder pain    right shoulder  . SOB (shortness of breath) on exertion   . Swelling    feet and legs    PAST SURGICAL HISTORY: Past Surgical History:  Procedure Laterality Date  . ABDOMINAL HYSTERECTOMY    . APPENDECTOMY  1977  . CARDIAC CATHETERIZATION  02/2009   no evidence of CAD/ Elevated LVEDP at the time ofcath c/w diastolic dysfunction  . KNEE SURGERY  1986  . NECK SURGERY    . OOPHORECTOMY  1972  . SHOULDER ARTHROSCOPY      SOCIAL HISTORY: Social  History  Substance Use Topics  . Smoking status: Never Smoker  . Smokeless tobacco: Never Used  . Alcohol use No    FAMILY HISTORY: Family History  Problem Relation Age of Onset  . Heart failure Mother   . Diabetes Mother   . Hypertension Mother   . Hyperlipidemia Mother   . Stroke Mother   . Heart disease Mother   . CAD Father   . Heart attack Father   . Hypertension Father   . Hyperlipidemia Father   . Heart disease Father   . Sudden death Father   . CAD Sister   . CAD Brother   . Pancreatic cancer Brother   . CAD Brother   . CAD Brother   . Heart failure Sister   . Other Brother        OPEN HEART  . Other Sister        OPEN HEART    ROS: Review of Systems  Constitutional: Positive for weight loss.  Gastrointestinal: Negative for nausea and  vomiting.  Musculoskeletal:       Negative muscle weakness  Endo/Heme/Allergies:       Negative hypoglycemia Negative polyphagia    PHYSICAL EXAM: Blood pressure 111/70, pulse (!) 54, temperature 98 F (36.7 C), temperature source Oral, height 5\' 2"  (1.575 m), weight 166 lb (75.3 kg), SpO2 98 %. Body mass index is 30.36 kg/m. Physical Exam  Constitutional: She is oriented to person, place, and time. She appears well-developed and well-nourished.  Cardiovascular:  Bradycardic  Pulmonary/Chest: Effort normal.  Musculoskeletal: Normal range of motion.  Neurological: She is oriented to person, place, and time.  Skin: Skin is warm and dry.  Psychiatric: She has a normal mood and affect. Her behavior is normal.  Vitals reviewed.   RECENT LABS AND TESTS: BMET    Component Value Date/Time   NA 142 05/26/2017 0939   K 4.5 05/26/2017 0939   CL 103 05/26/2017 0939   CO2 25 05/26/2017 0939   GLUCOSE 100 (H) 05/26/2017 0939   GLUCOSE 104 (H) 03/18/2016 1000   BUN 27 05/26/2017 0939   CREATININE 0.71 05/26/2017 0939   CREATININE 0.70 03/18/2016 1000   CALCIUM 9.8 05/26/2017 0939   GFRNONAA 85 05/26/2017 0939   GFRAA 98 05/26/2017 0939   Lab Results  Component Value Date   HGBA1C 5.4 05/26/2017   HGBA1C 5.7 (H) 01/27/2017   Lab Results  Component Value Date   INSULIN 13.1 05/26/2017   INSULIN 16.7 01/27/2017   CBC    Component Value Date/Time   WBC 7.3 01/27/2017 1040   WBC 10.0 04/22/2011 1439   RBC 4.31 01/27/2017 1040   RBC 4.08 04/22/2011 1439   HGB 13.4 01/27/2017 1040   HCT 41.1 01/27/2017 1040   PLT 231 01/27/2017 1040   MCV 95 01/27/2017 1040   MCH 31.1 01/27/2017 1040   MCH 31.9 04/22/2011 1439   MCHC 32.6 01/27/2017 1040   MCHC 34.6 04/22/2011 1439   RDW 13.3 01/27/2017 1040   LYMPHSABS 2.1 01/27/2017 1040   MONOABS 0.5 04/22/2011 1439   EOSABS 0.1 01/27/2017 1040   BASOSABS 0.0 01/27/2017 1040   Iron/TIBC/Ferritin/ %Sat No results found for: IRON,  TIBC, FERRITIN, IRONPCTSAT Lipid Panel     Component Value Date/Time   CHOL 133 05/26/2017 0939   TRIG 147 05/26/2017 0939   HDL 35 (L) 05/26/2017 0939   CHOLHDL 4.5 03/18/2016 1000   VLDL 25 03/18/2016 1000   LDLCALC 69 05/26/2017 0939  Hepatic Function Panel     Component Value Date/Time   PROT 6.6 05/26/2017 0939   ALBUMIN 4.6 05/26/2017 0939   AST 26 05/26/2017 0939   ALT 25 05/26/2017 0939   ALKPHOS 86 05/26/2017 0939   BILITOT 0.5 05/26/2017 0939   BILIDIR 0.1 02/07/2015 1521      Component Value Date/Time   TSH 1.710 01/27/2017 1040    ASSESSMENT AND PLAN: Vitamin D deficiency - Plan: Vitamin D, Ergocalciferol, (DRISDOL) 50000 units CAPS capsule  Prediabetes  Class 1 obesity with serious comorbidity and body mass index (BMI) of 30.0 to 30.9 in adult, unspecified obesity type  PLAN:  Vitamin D Deficiency Jackquline was informed that low vitamin D levels contributes to fatigue and are associated with obesity, breast, and colon cancer. She agrees to continue to take prescription Vit D @50 ,000 IU every week #4 with no refills and will follow up for routine testing of vitamin D, at least 2-3 times per year. She was informed of the risk of over-replacement of vitamin D and agrees to not increase her dose unless he discusses this with [place] first. Tida agrees to follow up with our clinic in 3 weeks.  Pre-Diabetes Amaal will continue to work on weight loss, exercise, and decreasing simple carbohydrates in her diet to help decrease the risk of diabetes. She was informed that eating too many simple carbohydrates or too many calories at one sitting increases the likelihood of GI side effects. Coral agreed to follow up with [place] as directed to monitor her progress.  Obesity Carlynn is currently in the action stage of change. As such, her goal is to continue with weight loss efforts She has agreed to keep a food journal with 1200 calories and 85 grams of protein daily Fernando has been  instructed to work up to a goal of 150 minutes of combined cardio and strengthening exercise per week for weight loss and overall health benefits. We discussed the following Behavioral Modification Strategies today: increasing lean protein intake and work on meal planning and easy cooking plans  Elda has agreed to follow up with our clinic in 3 weeks. She was informed of the importance of frequent follow up visits to maximize her success with intensive lifestyle modifications for her multiple health conditions.  I, [place] Crane, am acting as transcriptionist for Illa Level, PA-C  I have reviewed the above documentation for accuracy and completeness, and I agree with the above. -Illa Level, PA-C  I have reviewed the above note and agree with the plan. -Quillian Quince, MD   OBESITY BEHAVIORAL INTERVENTION VISIT  Today's visit was # 10 out of 22.  Starting weight: 202 lbs Starting date: 01/27/17 Today's weight : 166 lbs Today's date: 07/07/2017 Total lbs lost to date: 7 (Patients must lose 7 lbs in the first 6 months to continue with counseling)   ASK: We discussed the diagnosis of obesity with Pilar Jarvis Zavadil today and Lamonda agreed to give [place] permission to discuss obesity behavioral modification therapy today.  ASSESS: Jinx has the diagnosis of obesity and her BMI today is 30.35 Itzell is in the action stage of change   ADVISE: Brea was educated on the multiple health risks of obesity as well as the benefit of weight loss to improve her health. She was advised of the need for long term treatment and the importance of lifestyle modifications.  AGREE: Multiple dietary modification options and treatment options were discussed and  Neddie agreed to keep a food journal with  1200 calories and 85 grams of protein daily We discussed the following Behavioral Modification Strategies today: increasing lean protein intake and work on meal planning and easy cooking plans

## 2017-07-07 NOTE — Telephone Encounter (Signed)
New message  Pt c/o BP issue: STAT if pt c/o blurred vision, one-sided weakness or slurred speech  1. What are your last 5 BP readings? 99/60  110/70  109/69  104/65 2. Are you having any other symptoms (ex. Dizziness, headache, blurred vision, passed out)? headaches 3. What is your BP issue? High bp

## 2017-07-15 ENCOUNTER — Ambulatory Visit (HOSPITAL_BASED_OUTPATIENT_CLINIC_OR_DEPARTMENT_OTHER)
Admission: RE | Admit: 2017-07-15 | Discharge: 2017-07-15 | Disposition: A | Discharge status: Home / Self Care | Payer: Medicare Other | Source: Ambulatory Visit | Attending: Family Medicine | Admitting: Family Medicine

## 2017-07-15 DIAGNOSIS — Z1231 Encounter for screening mammogram for malignant neoplasm of breast: Secondary | ICD-10-CM | POA: Diagnosis not present

## 2017-07-29 ENCOUNTER — Ambulatory Visit (INDEPENDENT_AMBULATORY_CARE_PROVIDER_SITE_OTHER): Payer: Medicare Other | Admitting: Physician Assistant

## 2017-07-29 VITALS — BP 126/72 | HR 55 | Temp 98.0°F | Ht 62.0 in | Wt 163.0 lb

## 2017-07-29 DIAGNOSIS — E559 Vitamin D deficiency, unspecified: Secondary | ICD-10-CM | POA: Diagnosis not present

## 2017-07-29 DIAGNOSIS — I10 Essential (primary) hypertension: Secondary | ICD-10-CM | POA: Diagnosis not present

## 2017-07-29 DIAGNOSIS — Z6830 Body mass index (BMI) 30.0-30.9, adult: Secondary | ICD-10-CM | POA: Diagnosis not present

## 2017-07-29 DIAGNOSIS — E669 Obesity, unspecified: Secondary | ICD-10-CM | POA: Diagnosis not present

## 2017-07-29 MED ORDER — VITAMIN D (ERGOCALCIFEROL) 1.25 MG (50000 UNIT) PO CAPS
50000.0000 [IU] | ORAL_CAPSULE | ORAL | 0 refills | Status: DC
Start: 2017-07-29 — End: 2017-08-25

## 2017-07-29 NOTE — Progress Notes (Signed)
Office: [phone]  /  Fax: [phone]   HPI:   Chief Complaint: OBESITY [name] is here to discuss her progress with her obesity treatment plan. She is on the Category 2 plan and is following her eating plan approximately 90 to 100 % of the time. She states she is walking 24 minutes 7 times per week. [name] continues to do well with weight loss. She incorporates variety into her meals. [name] would like holiday eating strategies. Her weight is 163 lb (73.9 kg) today and has had a weight loss of 3 pounds over a period of 3 weeks since her last visit. She has lost 39 lbs since starting treatment with [place].  Vitamin D deficiency [name] has a diagnosis of vitamin D deficiency. She is currently taking vit D and denies nausea, vomiting or muscle weakness.  Hypertension [name] is a 76 y.o. female with hypertension. [name] denies chest pain or shortness of breath on exertion. She is working weight loss to help control her blood pressure with the goal of decreasing her risk of heart attack and stroke. [name] blood pressure is currently stable.  ALLERGIES: Allergies  Allergen Reactions  . Morphine And Related Nausea And Vomiting    MEDICATIONS: Current Outpatient Medications on File Prior to Visit  Medication Sig Dispense Refill  . aspirin 81 MG tablet Take 81 mg by mouth daily.    . furosemide (LASIX) 20 MG tablet Take 1 tablet (20 mg total) by mouth daily. 90 tablet 1  . lisinopril (PRINIVIL,ZESTRIL) 10 MG tablet Take 1 tablet (10 mg total) by mouth daily. 90 tablet 2  . lovastatin (MEVACOR) 40 MG tablet Take 1 tablet (40 mg total) by mouth at bedtime. 90 tablet 2  . meloxicam (MOBIC) 7.5 MG tablet Take 7.5 mg by mouth 2 (two) times daily.    . Multiple Vitamins-Minerals (WOMENS MULTI VITAMIN & MINERAL) TABS Take 1 tablet by mouth daily.    . Omega-3 Fatty Acids (FISH OIL) 1200 MG CAPS Take 1,200 mg by mouth 2 (two) times daily. TOTAL OF 2,400 MG DAILY    .  omeprazole (PRILOSEC) 20 MG capsule Take 20 mg by mouth daily.    . Vitamin D, Ergocalciferol, (DRISDOL) 50000 units CAPS capsule Take 1 capsule (50,000 Units total) by mouth every 7 (seven) days. 2 capsule 0  . zolpidem (AMBIEN) 10 MG tablet Take 5 mg by mouth at bedtime as needed for sleep.     . [DISCONTINUED] Omeprazole (PRILOSEC PO) Take 20 mg by mouth daily.      No current facility-administered medications on file prior to visit.     PAST MEDICAL HISTORY: Past Medical History:  Diagnosis Date  . Chronic diastolic CHF (congestive heart failure) (HCC)   . Constipation   . Diastolic dysfunction   . Gastric reflux   . GERD (gastroesophageal reflux disease)   . Hyperlipemia    dyslipidemia  . Hypertension   . Knee pain    bilateral  . Obesity   . Shoulder pain    right shoulder  . SOB (shortness of breath) on exertion   . Swelling    feet and legs    PAST SURGICAL HISTORY: Past Surgical History:  Procedure Laterality Date  . ABDOMINAL HYSTERECTOMY    . APPENDECTOMY  1977  . CARDIAC CATHETERIZATION  02/2009   no evidence of CAD/ Elevated LVEDP at the time ofcath c/w diastolic dysfunction  . KNEE SURGERY  1986  . NECK SURGERY    .  OOPHORECTOMY  1972  . SHOULDER ARTHROSCOPY      SOCIAL HISTORY: Social History   Tobacco Use  . Smoking status: Never Smoker  . Smokeless tobacco: Never Used  Substance Use Topics  . Alcohol use: No  . Drug use: No    FAMILY HISTORY: Family History  Problem Relation Age of Onset  . Heart failure Mother   . Diabetes Mother   . Hypertension Mother   . Hyperlipidemia Mother   . Stroke Mother   . Heart disease Mother   . CAD Father   . Heart attack Father   . Hypertension Father   . Hyperlipidemia Father   . Heart disease Father   . Sudden death Father   . CAD Sister   . CAD Brother   . Pancreatic cancer Brother   . CAD Brother   . CAD Brother   . Heart failure Sister   . Other Brother        OPEN HEART  . Other Sister         OPEN HEART    ROS: Review of Systems  Constitutional: Positive for weight loss.  Respiratory: Negative for shortness of breath (on exertion).   Cardiovascular: Negative for chest pain.  Gastrointestinal: Negative for nausea and vomiting.  Musculoskeletal:       Negative muscle weakness    PHYSICAL EXAM: Blood pressure 126/72, pulse (!) 55, temperature 98 F (36.7 C), temperature source Oral, height 5\' 2"  (1.575 m), weight 163 lb (73.9 kg), SpO2 100 %. Body mass index is 29.81 kg/m. Physical Exam  Constitutional: She is oriented to person, place, and time. She appears well-developed and well-nourished.  Cardiovascular:  Bradycardic  Pulmonary/Chest: Effort normal.  Musculoskeletal: Normal range of motion.  Neurological: She is oriented to person, place, and time.  Skin: Skin is warm and dry.  Psychiatric: She has a normal mood and affect. Her behavior is normal.  Vitals reviewed.   RECENT LABS AND TESTS: BMET    Component Value Date/Time   NA 142 05/26/2017 0939   K 4.5 05/26/2017 0939   CL 103 05/26/2017 0939   CO2 25 05/26/2017 0939   GLUCOSE 100 (H) 05/26/2017 0939   GLUCOSE 104 (H) 03/18/2016 1000   BUN 27 05/26/2017 0939   CREATININE 0.71 05/26/2017 0939   CREATININE 0.70 03/18/2016 1000   CALCIUM 9.8 05/26/2017 0939   GFRNONAA 85 05/26/2017 0939   GFRAA 98 05/26/2017 0939   Lab Results  Component Value Date   HGBA1C 5.4 05/26/2017   HGBA1C 5.7 (H) 01/27/2017   Lab Results  Component Value Date   INSULIN 13.1 05/26/2017   INSULIN 16.7 01/27/2017   CBC    Component Value Date/Time   WBC 7.3 01/27/2017 1040   WBC 10.0 04/22/2011 1439   RBC 4.31 01/27/2017 1040   RBC 4.08 04/22/2011 1439   HGB 13.4 01/27/2017 1040   HCT 41.1 01/27/2017 1040   PLT 231 01/27/2017 1040   MCV 95 01/27/2017 1040   MCH 31.1 01/27/2017 1040   MCH 31.9 04/22/2011 1439   MCHC 32.6 01/27/2017 1040   MCHC 34.6 04/22/2011 1439   RDW 13.3 01/27/2017 1040   LYMPHSABS  2.1 01/27/2017 1040   MONOABS 0.5 04/22/2011 1439   EOSABS 0.1 01/27/2017 1040   BASOSABS 0.0 01/27/2017 1040   Iron/TIBC/Ferritin/ %Sat No results found for: IRON, TIBC, FERRITIN, IRONPCTSAT Lipid Panel     Component Value Date/Time   CHOL 133 05/26/2017 0939   TRIG 147 05/26/2017  [id number]   HDL 35 (L) 05/26/2017 0939   CHOLHDL 4.5 03/18/2016 1000   VLDL 25 03/18/2016 1000   LDLCALC 69 05/26/2017 0939   Hepatic Function Panel     Component Value Date/Time   PROT 6.6 05/26/2017 0939   ALBUMIN 4.6 05/26/2017 0939   AST 26 05/26/2017 0939   ALT 25 05/26/2017 0939   ALKPHOS 86 05/26/2017 0939   BILITOT 0.5 05/26/2017 0939   BILIDIR 0.1 02/07/2015 1521      Component Value Date/Time   TSH 1.710 01/27/2017 1040    ASSESSMENT AND PLAN: Vitamin D deficiency - Plan: Vitamin D, Ergocalciferol, (DRISDOL) 50000 units CAPS capsule  Essential hypertension  Class 1 obesity with serious comorbidity and body mass index (BMI) of 30.0 to 30.9 in adult, unspecified obesity type - Starting BMI greater then 30  PLAN:  Vitamin D Deficiency [name] was informed that low vitamin D levels contributes to fatigue and are associated with obesity, breast, and colon cancer. She agrees to continue to take prescription Vit D @50 ,000 IU every week #4 with no refills and will follow up for routine testing of vitamin D, at least 2-3 times per year. She was informed of the risk of over-replacement of vitamin D and agrees to not increase her dose unless he discusses this with us first. [name] agrees to follow up with our clinic in 4 weeks.  Hypertension We discussed sodium restriction, working on healthy weight loss, and a regular exercise program as the means to achieve improved blood pressure control. [name] agreed with this plan and agreed to follow up as directed. We will continue to monitor her blood pressure as well as her progress with the above lifestyle modifications. She will continue her medications as  prescribed and will watch for signs of hypotension as she continues her lifestyle modifications.  Obesity [name] is currently in the action stage of change. As such, her goal is to continue with weight loss efforts She has agreed to keep a food journal with 1200 calories and 85 grams of protein daily [name] has been instructed to work up to a goal of 150 minutes of combined cardio and strengthening exercise per week for weight loss and overall health benefits. We discussed the following Behavioral Modification Strategies today: increasing lean protein intake and holiday eating strategies   [name] has agreed to follow up with our clinic in 4 weeks. She was informed of the importance of frequent follow up visits to maximize her success with intensive lifestyle modifications for her multiple health conditions.  I, [place] CraneJoanne Murray, am acting as transcriptionist for Illa LevelSahar Osman, PA-C  I have reviewed the above documentation for accuracy and completeness, and I agree with the above. -Illa LevelSahar Osman, PA-C  I have reviewed the above note and agree with the plan. -Quillian Quincearen Beasley, MD   OBESITY BEHAVIORAL INTERVENTION VISIT  Today's visit was # 11 out of 22.  Starting weight: 202 lbs Starting date: 01/27/17 Today's weight : 163 lbs  Today's date: 07/29/2017 Total lbs lost to date: 939 (Patients must lose 7 lbs in the first 6 months to continue with counseling)   ASK: We discussed the diagnosis of obesity with Pilar JarvisLinda S Francisco today and [name] agreed to give us permission to discuss obesity behavioral modification therapy today.  ASSESS: [name] has the diagnosis of obesity and her BMI today is 29.81 [name] is in the action stage of change   ADVISE: [name] was educated on the multiple health risks of obesity as well as  the benefit of weight loss to improve her health. She was advised of the need for long term treatment and the importance of lifestyle modifications.  AGREE: Multiple dietary  modification options and treatment options were discussed and  [name] agreed to keep a food journal with 1200 calories and 85 grams of protein daily. We discussed the following Behavioral Modification Strategies today: increasing lean protein intake and holiday eating strategies

## 2017-08-25 ENCOUNTER — Ambulatory Visit (INDEPENDENT_AMBULATORY_CARE_PROVIDER_SITE_OTHER): Payer: Medicare Other | Admitting: Physician Assistant

## 2017-08-25 VITALS — BP 118/70 | HR 56 | Temp 98.3°F | Ht 62.0 in | Wt 160.0 lb

## 2017-08-25 DIAGNOSIS — E559 Vitamin D deficiency, unspecified: Secondary | ICD-10-CM

## 2017-08-25 DIAGNOSIS — Z6830 Body mass index (BMI) 30.0-30.9, adult: Secondary | ICD-10-CM | POA: Diagnosis not present

## 2017-08-25 DIAGNOSIS — I10 Essential (primary) hypertension: Secondary | ICD-10-CM | POA: Diagnosis not present

## 2017-08-25 DIAGNOSIS — E669 Obesity, unspecified: Secondary | ICD-10-CM | POA: Diagnosis not present

## 2017-08-25 MED ORDER — VITAMIN D (ERGOCALCIFEROL) 1.25 MG (50000 UNIT) PO CAPS: 50000.0000 [IU] | ORAL_CAPSULE | ORAL | 0 refills | Status: DC

## 2017-08-25 NOTE — Progress Notes (Signed)
Office: [phone]  /  Fax: [phone]   HPI:   Chief Complaint: OBESITY [name] is here to discuss her progress with her obesity treatment plan. She is on the keep a food journal with 1200 calories and 85 grams of protein daily and is following her eating plan approximately 95 % of the time. She states she is walking (1 mile daily) for 24 minutes 7 times per week. [name] continues to do well with weight loss. [name] was on vacation but managed to be mindful of her eating and she controls her portions. Her weight is 160 lb (72.6 kg) today and has had a weight loss of 3 pounds over a period of 3 weeks since her last visit. She has lost 42 lbs since starting treatment with [place].  Vitamin D deficiency [name] has a diagnosis of vitamin D deficiency. She is currently taking vit D and denies nausea, vomiting or muscle weakness.  Hypertension [name] is a 76 y.o. female with hypertension. [name] denies chest pain or shortness of breath on exertion. She is working weight loss to help control her blood pressure with the goal of decreasing her risk of heart attack and stroke. [name] blood pressure is currently stable.  ALLERGIES: Allergies  Allergen Reactions   Morphine And Related Nausea And Vomiting    MEDICATIONS: Current Outpatient Medications on File Prior to Visit  Medication Sig Dispense Refill   aspirin 81 MG tablet Take 81 mg by mouth daily.     furosemide (LASIX) 20 MG tablet Take 1 tablet (20 mg total) by mouth daily. 90 tablet 1   lisinopril (PRINIVIL,ZESTRIL) 10 MG tablet Take 1 tablet (10 mg total) by mouth daily. 90 tablet 2   lovastatin (MEVACOR) 40 MG tablet Take 1 tablet (40 mg total) by mouth at bedtime. 90 tablet 2   meloxicam (MOBIC) 7.5 MG tablet Take 7.5 mg by mouth 2 (two) times daily.     Multiple Vitamins-Minerals (WOMENS MULTI VITAMIN & MINERAL) TABS Take 1 tablet by mouth daily.     Omega-3 Fatty Acids (FISH OIL) 1200 MG CAPS Take 1,200  mg by mouth 2 (two) times daily. TOTAL OF 2,400 MG DAILY     omeprazole (PRILOSEC) 20 MG capsule Take 20 mg by mouth daily.     zolpidem (AMBIEN) 10 MG tablet Take 5 mg by mouth at bedtime as needed for sleep.      [DISCONTINUED] Omeprazole (PRILOSEC PO) Take 20 mg by mouth daily.      No current facility-administered medications on file prior to visit.     PAST MEDICAL HISTORY: Past Medical History:  Diagnosis Date   Chronic diastolic CHF (congestive heart failure) (HCC)    Constipation    Diastolic dysfunction    Gastric reflux    GERD (gastroesophageal reflux disease)    Hyperlipemia    dyslipidemia   Hypertension    Knee pain    bilateral   Obesity    Shoulder pain    right shoulder   SOB (shortness of breath) on exertion    Swelling    feet and legs    PAST SURGICAL HISTORY: Past Surgical History:  Procedure Laterality Date   ABDOMINAL HYSTERECTOMY     APPENDECTOMY  1977   CARDIAC CATHETERIZATION  02/2009   no evidence of CAD/ Elevated LVEDP at the time ofcath c/w diastolic dysfunction   KNEE SURGERY  1986   NECK SURGERY     OOPHORECTOMY  1972   SHOULDER ARTHROSCOPY  SOCIAL HISTORY: Social History   Tobacco Use   Smoking status: Never Smoker   Smokeless tobacco: Never Used  Substance Use Topics   Alcohol use: No   Drug use: No    FAMILY HISTORY: Family History  Problem Relation Age of Onset   Heart failure Mother    Diabetes Mother    Hypertension Mother    Hyperlipidemia Mother    Stroke Mother    Heart disease Mother    CAD Father    Heart attack Father    Hypertension Father    Hyperlipidemia Father    Heart disease Father    Sudden death Father    CAD Sister    CAD Brother    Pancreatic cancer Brother    CAD Brother    CAD Brother    Heart failure Sister    Other Brother        OPEN HEART   Other Sister        OPEN HEART    ROS: Review of Systems  Constitutional: Positive for  weight loss.  Respiratory: Negative for shortness of breath (on exertion).   Cardiovascular: Negative for chest pain.  Gastrointestinal: Negative for nausea and vomiting.  Musculoskeletal:       Negative muscle weakness    PHYSICAL EXAM: Blood pressure 118/70, pulse (!) 56, temperature 98.3 F (36.8 C), temperature source Oral, height 5\' 2"  (1.575 m), weight 160 lb (72.6 kg), SpO2 99 %. Body mass index is 29.26 kg/m. Physical Exam  Constitutional: She is oriented to person, place, and time. She appears well-developed and well-nourished.  Cardiovascular:  Bradycardic  Pulmonary/Chest: Effort normal.  Musculoskeletal: Normal range of motion.  Neurological: She is oriented to person, place, and time.  Skin: Skin is warm and dry.  Psychiatric: She has a normal mood and affect. Her behavior is normal.  Vitals reviewed.   RECENT LABS AND TESTS: BMET    Component Value Date/Time   NA 142 05/26/2017 0939   K 4.5 05/26/2017 0939   CL 103 05/26/2017 0939   CO2 25 05/26/2017 0939   GLUCOSE 100 (H) 05/26/2017 0939   GLUCOSE 104 (H) 03/18/2016 1000   BUN 27 05/26/2017 0939   CREATININE 0.71 05/26/2017 0939   CREATININE 0.70 03/18/2016 1000   CALCIUM 9.8 05/26/2017 0939   GFRNONAA 85 05/26/2017 0939   GFRAA 98 05/26/2017 0939   Lab Results  Component Value Date   HGBA1C 5.4 05/26/2017   HGBA1C 5.7 (H) 01/27/2017   Lab Results  Component Value Date   INSULIN 13.1 05/26/2017   INSULIN 16.7 01/27/2017   CBC    Component Value Date/Time   WBC 7.3 01/27/2017 1040   WBC 10.0 04/22/2011 1439   RBC 4.31 01/27/2017 1040   RBC 4.08 04/22/2011 1439   HGB 13.4 01/27/2017 1040   HCT 41.1 01/27/2017 1040   PLT 231 01/27/2017 1040   MCV 95 01/27/2017 1040   MCH 31.1 01/27/2017 1040   MCH 31.9 04/22/2011 1439   MCHC 32.6 01/27/2017 1040   MCHC 34.6 04/22/2011 1439   RDW 13.3 01/27/2017 1040   LYMPHSABS 2.1 01/27/2017 1040   MONOABS 0.5 04/22/2011 1439   EOSABS 0.1 01/27/2017  1040   BASOSABS 0.0 01/27/2017 1040   Iron/TIBC/Ferritin/ %Sat No results found for: IRON, TIBC, FERRITIN, IRONPCTSAT Lipid Panel     Component Value Date/Time   CHOL 133 05/26/2017 0939   TRIG 147 05/26/2017 0939   HDL 35 (L) 05/26/2017 0939   CHOLHDL 4.5  03/18/2016 1000   VLDL 25 03/18/2016 1000   LDLCALC 69 05/26/2017 0939   Hepatic Function Panel     Component Value Date/Time   PROT 6.6 05/26/2017 0939   ALBUMIN 4.6 05/26/2017 0939   AST 26 05/26/2017 0939   ALT 25 05/26/2017 0939   ALKPHOS 86 05/26/2017 0939   BILITOT 0.5 05/26/2017 0939   BILIDIR 0.1 02/07/2015 1521      Component Value Date/Time   TSH 1.710 01/27/2017 1040    ASSESSMENT AND PLAN: Vitamin D deficiency - Plan: Vitamin D, Ergocalciferol, (DRISDOL) 50000 units CAPS capsule  Essential hypertension  Class 1 obesity with serious comorbidity and body mass index (BMI) of 30.0 to 30.9 in adult, unspecified obesity type - Starting BMI greater then 30  PLAN:  Vitamin D Deficiency [name] was informed that low vitamin D levels contributes to fatigue and are associated with obesity, breast, and colon cancer. She agrees to continue to take prescription Vit D @50 ,000 IU every other week #2 with no refills and will follow up for routine testing of vitamin D, at least 2-3 times per year. She was informed of the risk of over-replacement of vitamin D and agrees to not increase her dose unless he discusses this with us first. [name] agrees to follow up with our clinic in 4 weeks.  Hypertension We discussed sodium restriction, working on healthy weight loss, and a regular exercise program as the means to achieve improved blood pressure control. [name] agreed with this plan and agreed to follow up as directed. We will continue to monitor her blood pressure as well as her progress with the above lifestyle modifications. She will continue her medications as prescribed and will watch for signs of hypotension as she continues her  lifestyle modifications.  Obesity [name] is currently in the action stage of change. As such, her goal is to continue with weight loss efforts She has agreed to keep a food journal with 1200 calories and 85 grams of protein daily [name] has been instructed to work up to a goal of 150 minutes of combined cardio and strengthening exercise per week for weight loss and overall health benefits. We discussed the following Behavioral Modification Strategies today: increasing lean protein intake and work on meal planning and easy cooking plans  [name] has agreed to follow up with our clinic in 4 weeks. She was informed of the importance of frequent follow up visits to maximize her success with intensive lifestyle modifications for her multiple health conditions.  I, [place] CraneJoanne Murray, am acting as transcriptionist for Illa LevelSahar Osman, PA-C  I have reviewed the above documentation for accuracy and completeness, and I agree with the above. -Illa LevelSahar Osman, PA-C  I have reviewed the above note and agree with the plan. -Quillian Quincearen Beasley, MD   OBESITY BEHAVIORAL INTERVENTION VISIT  Today's visit was # 12 out of 22.  Starting weight: 202 lbs Starting date: 01/27/17 Today's weight : 160 lbs Today's date: 08/25/2017 Total lbs lost to date: 4642 (Patients must lose 7 lbs in the first 6 months to continue with counseling)   ASK: We discussed the diagnosis of obesity with Pilar JarvisLinda S Cooley today and [name] agreed to give us permission to discuss obesity behavioral modification therapy today.  ASSESS: [name] has the diagnosis of obesity and her BMI today is 29.26 [name] is in the action stage of change   ADVISE: [name] was educated on the multiple health risks of obesity as well as the benefit of weight loss to improve her  health. She was advised of the need for long term treatment and the importance of lifestyle modifications.  AGREE: Multiple dietary modification options and treatment options were discussed and   Mckinzi agreed to keep a food journal with 1200 calories and 85 grams of protein daily We discussed the following Behavioral Modification Strategies today: increasing lean protein intake and work on meal planning and easy cooking plans

## 2017-09-22 ENCOUNTER — Ambulatory Visit (INDEPENDENT_AMBULATORY_CARE_PROVIDER_SITE_OTHER): Payer: Medicare Other | Admitting: Physician Assistant

## 2017-09-22 VITALS — BP 120/72 | HR 54 | Temp 97.7°F | Ht 62.0 in | Wt 157.0 lb

## 2017-09-22 DIAGNOSIS — Z6830 Body mass index (BMI) 30.0-30.9, adult: Secondary | ICD-10-CM

## 2017-09-22 DIAGNOSIS — E559 Vitamin D deficiency, unspecified: Secondary | ICD-10-CM | POA: Diagnosis not present

## 2017-09-22 DIAGNOSIS — E669 Obesity, unspecified: Secondary | ICD-10-CM

## 2017-09-22 DIAGNOSIS — E7849 Other hyperlipidemia: Secondary | ICD-10-CM | POA: Diagnosis not present

## 2017-09-22 DIAGNOSIS — R7303 Prediabetes: Secondary | ICD-10-CM | POA: Diagnosis not present

## 2017-09-22 MED ORDER — VITAMIN D (ERGOCALCIFEROL) 1.25 MG (50000 UNIT) PO CAPS
50000.0000 [IU] | ORAL_CAPSULE | ORAL | 0 refills | Status: DC
Start: 2017-09-22 — End: 2017-11-10

## 2017-09-22 NOTE — Progress Notes (Addendum)
Office: [phone]  /  Fax: [phone]   HPI:   Chief Complaint: OBESITY [name] is here to discuss her progress with her obesity treatment plan. She is on the keep a food journal with 1200 calories and 85 grams of protein daily and is following her eating plan approximately 95 % of the time. She states she is walking 1 mile 7 times per week. [name] continues to do well with weight loss. She is very mindful of her eating. She states she has noticed an increase in hunger. Her weight is 157 lb (71.2 kg) today and has had a weight loss of 3 pounds over a period of 4 weeks since her last visit. She has lost 45 lbs since starting treatment with [place].  Hyperlipidemia [name] has hyperlipidemia and is on statin. She has been trying to improve her cholesterol levels with intensive lifestyle modification including a low saturated fat diet, exercise and weight loss. She denies any chest pain or claudication.  Vitamin D deficiency [name] has a diagnosis of vitamin D deficiency. She is currently taking vit D and level is at goal. [name] denies nausea, vomiting or muscle weakness.   Ref. Range 05/26/2017 09:39  Vitamin D, 25-Hydroxy Latest Ref Range: 30.0 - 100.0 ng/mL 61.7   Pre-Diabetes [name] has a diagnosis of pre-diabetes based on her elevated Hgb A1c and was informed this puts her at greater risk of developing diabetes. She is not on metformin and continues to work on diet and exercise to decrease risk of diabetes. She admits polyphagia and denies nausea or hypoglycemia.  ALLERGIES: Allergies  Allergen Reactions  . Morphine And Related Nausea And Vomiting    MEDICATIONS: Current Outpatient Medications on File Prior to Visit  Medication Sig Dispense Refill  . aspirin 81 MG tablet Take 81 mg by mouth daily.    . furosemide (LASIX) 20 MG tablet Take 1 tablet (20 mg total) by mouth daily. 90 tablet 1  . lisinopril (PRINIVIL,ZESTRIL) 10 MG tablet Take 1 tablet (10 mg total) by mouth daily. 90  tablet 2  . lovastatin (MEVACOR) 40 MG tablet Take 1 tablet (40 mg total) by mouth at bedtime. 90 tablet 2  . meloxicam (MOBIC) 7.5 MG tablet Take 7.5 mg by mouth 2 (two) times daily.    . Multiple Vitamins-Minerals (WOMENS MULTI VITAMIN & MINERAL) TABS Take 1 tablet by mouth daily.    . Omega-3 Fatty Acids (FISH OIL) 1200 MG CAPS Take 1,200 mg by mouth 2 (two) times daily. TOTAL OF 2,400 MG DAILY    . omeprazole (PRILOSEC) 20 MG capsule Take 20 mg by mouth daily.    . Vitamin D, Ergocalciferol, (DRISDOL) 50000 units CAPS capsule Take 1 capsule (50,000 Units total) by mouth as directed. Take one tab every other week 2 capsule 0  . zolpidem (AMBIEN) 10 MG tablet Take 5 mg by mouth at bedtime as needed for sleep.     . [DISCONTINUED] Omeprazole (PRILOSEC PO) Take 20 mg by mouth daily.      No current facility-administered medications on file prior to visit.     PAST MEDICAL HISTORY: Past Medical History:  Diagnosis Date  . Chronic diastolic CHF (congestive heart failure) (HCC)   . Constipation   . Diastolic dysfunction   . Gastric reflux   . GERD (gastroesophageal reflux disease)   . Hyperlipemia    dyslipidemia  . Hypertension   . Knee pain    bilateral  . Obesity   . Shoulder pain  right shoulder  . SOB (shortness of breath) on exertion   . Swelling    feet and legs    PAST SURGICAL HISTORY: Past Surgical History:  Procedure Laterality Date  . ABDOMINAL HYSTERECTOMY    . APPENDECTOMY  1977  . CARDIAC CATHETERIZATION  02/2009   no evidence of CAD/ Elevated LVEDP at the time ofcath c/w diastolic dysfunction  . KNEE SURGERY  1986  . NECK SURGERY    . OOPHORECTOMY  1972  . SHOULDER ARTHROSCOPY      SOCIAL HISTORY: Social History   Tobacco Use  . Smoking status: Never Smoker  . Smokeless tobacco: Never Used  Substance Use Topics  . Alcohol use: No  . Drug use: No    FAMILY HISTORY: Family History  Problem Relation Age of Onset  . Heart failure Mother   .  Diabetes Mother   . Hypertension Mother   . Hyperlipidemia Mother   . Stroke Mother   . Heart disease Mother   . CAD Father   . Heart attack Father   . Hypertension Father   . Hyperlipidemia Father   . Heart disease Father   . Sudden death Father   . CAD Sister   . CAD Brother   . Pancreatic cancer Brother   . CAD Brother   . CAD Brother   . Heart failure Sister   . Other Brother        OPEN HEART  . Other Sister        OPEN HEART    ROS: Review of Systems  Constitutional: Positive for weight loss.  Cardiovascular: Negative for chest pain and claudication.  Gastrointestinal: Negative for nausea and vomiting.  Musculoskeletal:       Negative muscle weakness  Endo/Heme/Allergies:       Positive polyphagia Negative hypoglycemia    PHYSICAL EXAM: Blood pressure 120/72, pulse (!) 54, temperature 97.7 F (36.5 C), height 5\' 2"  (1.575 m), weight 157 lb (71.2 kg), SpO2 100 %. Body mass index is 28.72 kg/m. Physical Exam  Constitutional: She is oriented to person, place, and time. She appears well-developed and well-nourished.  Cardiovascular:  Bradycardic  Pulmonary/Chest: Effort normal.  Musculoskeletal: Normal range of motion.  Neurological: She is oriented to person, place, and time.  Skin: Skin is warm and dry.  Psychiatric: She has a normal mood and affect. Her behavior is normal.  Vitals reviewed.   RECENT LABS AND TESTS: BMET    Component Value Date/Time   NA 142 05/26/2017 0939   K 4.5 05/26/2017 0939   CL 103 05/26/2017 0939   CO2 25 05/26/2017 0939   GLUCOSE 100 (H) 05/26/2017 0939   GLUCOSE 104 (H) 03/18/2016 1000   BUN 27 05/26/2017 0939   CREATININE 0.71 05/26/2017 0939   CREATININE 0.70 03/18/2016 1000   CALCIUM 9.8 05/26/2017 0939   GFRNONAA 85 05/26/2017 0939   GFRAA 98 05/26/2017 0939   Lab Results  Component Value Date   HGBA1C 5.4 05/26/2017   HGBA1C 5.7 (H) 01/27/2017   Lab Results  Component Value Date   INSULIN 13.1 05/26/2017     INSULIN 16.7 01/27/2017   CBC    Component Value Date/Time   WBC 7.3 01/27/2017 1040   WBC 10.0 04/22/2011 1439   RBC 4.31 01/27/2017 1040   RBC 4.08 04/22/2011 1439   HGB 13.4 01/27/2017 1040   HCT 41.1 01/27/2017 1040   PLT 231 01/27/2017 1040   MCV 95 01/27/2017 1040   MCH 31.1 01/27/2017  1040   MCH 31.9 04/22/2011 1439   MCHC 32.6 01/27/2017 1040   MCHC 34.6 04/22/2011 1439   RDW 13.3 01/27/2017 1040   LYMPHSABS 2.1 01/27/2017 1040   MONOABS 0.5 04/22/2011 1439   EOSABS 0.1 01/27/2017 1040   BASOSABS 0.0 01/27/2017 1040   Iron/TIBC/Ferritin/ %Sat No results found for: IRON, TIBC, FERRITIN, IRONPCTSAT Lipid Panel     Component Value Date/Time   CHOL 133 05/26/2017 0939   TRIG 147 05/26/2017 0939   HDL 35 (L) 05/26/2017 0939   CHOLHDL 4.5 03/18/2016 1000   VLDL 25 03/18/2016 1000   LDLCALC 69 05/26/2017 0939   Hepatic Function Panel     Component Value Date/Time   PROT 6.6 05/26/2017 0939   ALBUMIN 4.6 05/26/2017 0939   AST 26 05/26/2017 0939   ALT 25 05/26/2017 0939   ALKPHOS 86 05/26/2017 0939   BILITOT 0.5 05/26/2017 0939   BILIDIR 0.1 02/07/2015 1521      Component Value Date/Time   TSH 1.710 01/27/2017 1040     Ref. Range 05/26/2017 09:39  Vitamin D, 25-Hydroxy Latest Ref Range: 30.0 - 100.0 ng/mL 61.7    ASSESSMENT AND PLAN: Vitamin D deficiency - Plan: VITAMIN D 25 Hydroxy (Vit-D Deficiency, Fractures), Vitamin D, Ergocalciferol, (DRISDOL) 50000 units CAPS capsule  Other hyperlipidemia - Plan: Lipid Panel With LDL/HDL Ratio  Prediabetes - Plan: Comprehensive metabolic panel, Hemoglobin A1c, Insulin, random  Class 1 obesity with serious comorbidity and body mass index (BMI) of 30.0 to 30.9 in adult, unspecified obesity type - Starting BMI greater then 30  PLAN:  Hyperlipidemia [name] was informed of the American Heart Association Guidelines emphasizing intensive lifestyle modifications as the first line treatment for hyperlipidemia. We  discussed many lifestyle modifications today in depth, and [name] will continue to work on decreasing saturated fats such as fatty red meat, butter and many fried foods. She will also increase vegetables and lean protein in her diet and continue to work on exercise and weight loss efforts. We will check labs and [name] agrees to continue medications as prescribed.  Vitamin D Deficiency [name] was informed that low vitamin D levels contributes to fatigue and are associated with obesity, breast, and colon cancer. She agrees to continue to take prescription Vit D @50 ,000 IU every 14 days #4 with no refills. We will check labs and will follow up for routine testing of vitamin D, at least 2-3 times per year. She was informed of the risk of over-replacement of vitamin D and agrees to not increase her dose unless he discusses this with [place] first. [name] agrees to follow up with our clinic in 4 weeks.  Pre-Diabetes [name] will continue to work on weight loss, exercise, and decreasing simple carbohydrates in her diet to help decrease the risk of diabetes. She was informed that eating too many simple carbohydrates or too many calories at one sitting increases the likelihood of GI side effects. She declines metformin today. [name] agreed to follow up with [place] as directed to monitor her progress.  Obesity [name] is currently in the action stage of change. As such, her goal is to continue with weight loss efforts She has agreed to keep a food journal with 1200 calories and 85 grams of protein daily [name] has been instructed to work up to a goal of 150 minutes of combined cardio and strengthening exercise per week for weight loss and overall health benefits. We discussed the following Behavioral Modification Strategies today: no skipping meals, keep a strict food  journal and increasing lean protein intake  Briele has agreed to follow up with our clinic in 4 weeks. She was informed of the importance of frequent follow up  visits to maximize her success with intensive lifestyle modifications for her multiple health conditions.  [name] Loron, am acting as transcriptionist for Illa Level, PA-C I Illa Level [facility] have reviewed this note and agree with its contents   OBESITY BEHAVIORAL INTERVENTION VISIT  Today's visit was # 13 out of 22.  Starting weight: 202 lbs Starting date: 01/27/17 Today's weight : 157 lbs Today's date: 09/22/2017 Total lbs lost to date: 45 (Patients must lose 7 lbs in the first 6 months to continue with counseling)   ASK: We discussed the diagnosis of obesity with [name] today and [name] agreed to give [place] permission to discuss obesity behavioral modification therapy today.  ASSESS: [name] has the diagnosis of obesity and her BMI today is 28.71 [name] is in the action stage of change   ADVISE: [name] was educated on the multiple health risks of obesity as well as the benefit of weight loss to improve her health. She was advised of the need for long term treatment and the importance of lifestyle modifications.  AGREE: Multiple dietary modification options and treatment options were discussed and  [name] agreed to the above obesity treatment plan.  I have reviewed the above documentation for accuracy and completeness, and I agree with the above. -Quillian Quince, MD

## 2017-09-23 LAB — COMPREHENSIVE METABOLIC PANEL
ALBUMIN: 4.9 g/dL — AB (ref 3.5–4.8)
ALK PHOS: 83 IU/L (ref 39–117)
ALT: 23 IU/L (ref 0–32)
AST: 27 IU/L (ref 0–40)
Albumin/Globulin Ratio: 2.2 (ref 1.2–2.2)
BILIRUBIN TOTAL: 0.5 mg/dL (ref 0.0–1.2)
BUN/Creatinine Ratio: 35 — ABNORMAL HIGH (ref 12–28)
BUN: 26 mg/dL (ref 8–27)
CHLORIDE: 101 mmol/L (ref 96–106)
CO2: 26 mmol/L (ref 20–29)
Calcium: 9.6 mg/dL (ref 8.7–10.3)
Creatinine, Ser: 0.75 mg/dL (ref 0.57–1.00)
GFR calc Af Amer: 91 mL/min/{1.73_m2} (ref >59)
GFR calc non Af Amer: 79 mL/min/{1.73_m2} (ref >59)
GLUCOSE: 96 mg/dL (ref 65–99)
Globulin, Total: 2.2 g/dL (ref 1.5–4.5)
Potassium: 4.7 mmol/L (ref 3.5–5.2)
Sodium: 142 mmol/L (ref 134–144)
Total Protein: 7.1 g/dL (ref 6.0–8.5)

## 2017-09-23 LAB — LIPID PANEL WITH LDL/HDL RATIO
CHOLESTEROL TOTAL: 154 mg/dL (ref 100–199)
HDL: 40 mg/dL (ref >39)
LDL CALC: 90 mg/dL (ref 0–99)
LDL/HDL RATIO: 2.3 ratio (ref 0.0–3.2)
Triglycerides: 118 mg/dL (ref 0–149)
VLDL Cholesterol Cal: 24 mg/dL (ref 5–40)

## 2017-09-23 LAB — VITAMIN D 25 HYDROXY (VIT D DEFICIENCY, FRACTURES): Vit D, 25-Hydroxy: 62.5 ng/mL (ref 30.0–100.0)

## 2017-09-23 LAB — INSULIN, RANDOM: INSULIN: 6.2 u[IU]/mL (ref 2.6–24.9)

## 2017-09-23 LAB — HEMOGLOBIN A1C
ESTIMATED AVERAGE GLUCOSE: 111 mg/dL
HEMOGLOBIN A1C: 5.5 % (ref 4.8–5.6)

## 2017-09-25 ENCOUNTER — Other Ambulatory Visit: Payer: Self-pay | Admitting: Cardiology

## 2017-09-29 ENCOUNTER — Encounter: Payer: Self-pay | Admitting: Cardiology

## 2017-10-07 ENCOUNTER — Encounter: Payer: Self-pay | Admitting: Cardiology

## 2017-10-16 ENCOUNTER — Telehealth (INDEPENDENT_AMBULATORY_CARE_PROVIDER_SITE_OTHER): Payer: Self-pay | Admitting: Physician Assistant

## 2017-10-16 NOTE — Telephone Encounter (Signed)
Pt cancelled appt on 2/11 and resched to 11/11/17.  She is requesting that you look at her chart specifically labs and let her know if there is anything she needs to change.  She is going out of town

## 2017-10-16 NOTE — Telephone Encounter (Signed)
Returned call to patient. Informed patient that she will need a follow up appointment with a PA prior to making any medication adjustments. Patient is scheduled with Boyce MediciBrittany Simmons, PA on 11/17/17. Patient in agreement with plan and thankful for the call.

## 2017-10-20 ENCOUNTER — Ambulatory Visit (INDEPENDENT_AMBULATORY_CARE_PROVIDER_SITE_OTHER): Payer: Medicare Other | Admitting: Physician Assistant

## 2017-10-30 NOTE — Telephone Encounter (Signed)
Called pt left a message that her labs were overall normal and to make sure she is drinking plenty of fluids daily. April, CMA

## 2017-11-10 ENCOUNTER — Ambulatory Visit (INDEPENDENT_AMBULATORY_CARE_PROVIDER_SITE_OTHER): Payer: Medicare Other | Admitting: Physician Assistant

## 2017-11-10 VITALS — BP 115/71 | HR 56 | Temp 97.9°F | Ht 62.0 in | Wt 155.0 lb

## 2017-11-10 DIAGNOSIS — R7303 Prediabetes: Secondary | ICD-10-CM | POA: Diagnosis not present

## 2017-11-10 DIAGNOSIS — Z6830 Body mass index (BMI) 30.0-30.9, adult: Secondary | ICD-10-CM

## 2017-11-10 DIAGNOSIS — E669 Obesity, unspecified: Secondary | ICD-10-CM

## 2017-11-10 NOTE — Progress Notes (Signed)
Office: [phone]  /  Fax: [phone]   HPI:   Chief Complaint: OBESITY Trana is here to discuss her progress with her obesity treatment plan. She is on the Category 2 plan and is following her eating plan approximately 99 % of the time. She states she is walking 15,000 steps and lifting weights 7 times per week. Irlanda continues to do well with weight loss. She is very mindful of her eating and makes smarter food choices. Also does daily weight lifting and walks. She would like more healthy meal option ideas.  Her weight is 155 lb (70.3 kg) today and has had a weight loss of 2 pounds over a period of 7 weeks since her last visit. She has lost 47 lbs since starting treatment with [place].  Pre-Diabetes Kensleigh has a diagnosis of pre-diabetes based on her elevated Hgb A1c and was informed this puts her at greater risk of developing diabetes. Sintia's A1c improved to 5.6 and she denies polyphagia. She is not on metformin currently and continues to work on diet and exercise to decrease risk of diabetes. She denies nausea or hypoglycemia.  ALLERGIES: Allergies  Allergen Reactions  . Morphine And Related Nausea And Vomiting    MEDICATIONS: Current Outpatient Medications on File Prior to Visit  Medication Sig Dispense Refill  . aspirin 81 MG tablet Take 81 mg by mouth daily.    . furosemide (LASIX) 20 MG tablet Take 1 tablet (20 mg total) by mouth daily. 90 tablet 0  . lisinopril (PRINIVIL,ZESTRIL) 10 MG tablet Take 1 tablet (10 mg total) by mouth daily. 90 tablet 2  . lovastatin (MEVACOR) 40 MG tablet Take 1 tablet (40 mg total) by mouth at bedtime. 90 tablet 2  . meloxicam (MOBIC) 7.5 MG tablet Take 7.5 mg by mouth 2 (two) times daily.    . Multiple Vitamins-Minerals (WOMENS MULTI VITAMIN & MINERAL) TABS Take 1 tablet by mouth daily.    . Omega-3 Fatty Acids (FISH OIL) 1200 MG CAPS Take 1,200 mg by mouth 2 (two) times daily. TOTAL OF 2,400 MG DAILY    . omeprazole (PRILOSEC) 20 MG capsule  Take 20 mg by mouth daily.    Marland Kitchen zolpidem (AMBIEN) 10 MG tablet Take 5 mg by mouth at bedtime as needed for sleep.     . [DISCONTINUED] Omeprazole (PRILOSEC PO) Take 20 mg by mouth daily.      No current facility-administered medications on file prior to visit.     PAST MEDICAL HISTORY: Past Medical History:  Diagnosis Date  . Chronic diastolic CHF (congestive heart failure) (HCC)   . Constipation   . Diastolic dysfunction   . Gastric reflux   . GERD (gastroesophageal reflux disease)   . Hyperlipemia    dyslipidemia  . Hypertension   . Knee pain    bilateral  . Obesity   . Shoulder pain    right shoulder  . SOB (shortness of breath) on exertion   . Swelling    feet and legs    PAST SURGICAL HISTORY: Past Surgical History:  Procedure Laterality Date  . ABDOMINAL HYSTERECTOMY    . APPENDECTOMY  1977  . CARDIAC CATHETERIZATION  02/2009   no evidence of CAD/ Elevated LVEDP at the time ofcath c/w diastolic dysfunction  . KNEE SURGERY  1986  . NECK SURGERY    . OOPHORECTOMY  1972  . SHOULDER ARTHROSCOPY      SOCIAL HISTORY: Social History   Tobacco Use  . Smoking status: Never Smoker  .  Smokeless tobacco: Never Used  Substance Use Topics  . Alcohol use: No  . Drug use: No    FAMILY HISTORY: Family History  Problem Relation Age of Onset  . Heart failure Mother   . Diabetes Mother   . Hypertension Mother   . Hyperlipidemia Mother   . Stroke Mother   . Heart disease Mother   . CAD Father   . Heart attack Father   . Hypertension Father   . Hyperlipidemia Father   . Heart disease Father   . Sudden death Father   . CAD Sister   . CAD Brother   . Pancreatic cancer Brother   . CAD Brother   . CAD Brother   . Heart failure Sister   . Other Brother        OPEN HEART  . Other Sister        OPEN HEART    ROS: Review of Systems  Constitutional: Positive for weight loss.  Gastrointestinal: Negative for nausea.  Endo/Heme/Allergies:       Negative  polyphagia Negative hypoglycemia    PHYSICAL EXAM: Blood pressure 115/71, pulse (!) 56, temperature 97.9 F (36.6 C), temperature source Oral, height 5\' 2"  (1.575 m), weight 155 lb (70.3 kg), SpO2 100 %. Body mass index is 28.35 kg/m. Physical Exam  Constitutional: She is oriented to person, place, and time. She appears well-developed and well-nourished.  Cardiovascular: Bradycardia present.  Pulmonary/Chest: Effort normal.  Musculoskeletal: Normal range of motion.  Neurological: She is oriented to person, place, and time.  Skin: Skin is warm and dry.  Psychiatric: She has a normal mood and affect. Her behavior is normal.  Vitals reviewed.   RECENT LABS AND TESTS: BMET    Component Value Date/Time   NA 142 09/22/2017 0929   K 4.7 09/22/2017 0929   CL 101 09/22/2017 0929   CO2 26 09/22/2017 0929   GLUCOSE 96 09/22/2017 0929   GLUCOSE 104 (H) 03/18/2016 1000   BUN 26 09/22/2017 0929   CREATININE 0.75 09/22/2017 0929   CREATININE 0.70 03/18/2016 1000   CALCIUM 9.6 09/22/2017 0929   GFRNONAA 79 09/22/2017 0929   GFRAA 91 09/22/2017 0929   Lab Results  Component Value Date   HGBA1C 5.5 09/22/2017   HGBA1C 5.4 05/26/2017   HGBA1C 5.7 (H) 01/27/2017   Lab Results  Component Value Date   INSULIN 6.2 09/22/2017   INSULIN 13.1 05/26/2017   INSULIN 16.7 01/27/2017   CBC    Component Value Date/Time   WBC 7.3 01/27/2017 1040   WBC 10.0 04/22/2011 1439   RBC 4.31 01/27/2017 1040   RBC 4.08 04/22/2011 1439   HGB 13.4 01/27/2017 1040   HCT 41.1 01/27/2017 1040   PLT 231 01/27/2017 1040   MCV 95 01/27/2017 1040   MCH 31.1 01/27/2017 1040   MCH 31.9 04/22/2011 1439   MCHC 32.6 01/27/2017 1040   MCHC 34.6 04/22/2011 1439   RDW 13.3 01/27/2017 1040   LYMPHSABS 2.1 01/27/2017 1040   MONOABS 0.5 04/22/2011 1439   EOSABS 0.1 01/27/2017 1040   BASOSABS 0.0 01/27/2017 1040   Iron/TIBC/Ferritin/ %Sat No results found for: IRON, TIBC, FERRITIN, IRONPCTSAT Lipid Panel      Component Value Date/Time   CHOL 154 09/22/2017 0929   TRIG 118 09/22/2017 0929   HDL 40 09/22/2017 0929   CHOLHDL 4.5 03/18/2016 1000   VLDL 25 03/18/2016 1000   LDLCALC 90 09/22/2017 0929   Hepatic Function Panel     Component Value  Date/Time   PROT 7.1 09/22/2017 0929   ALBUMIN 4.9 (H) 09/22/2017 0929   AST 27 09/22/2017 0929   ALT 23 09/22/2017 0929   ALKPHOS 83 09/22/2017 0929   BILITOT 0.5 09/22/2017 0929   BILIDIR 0.1 02/07/2015 1521      Component Value Date/Time   TSH 1.710 01/27/2017 1040    ASSESSMENT AND PLAN: Prediabetes  Class 1 obesity with serious comorbidity and body mass index (BMI) of 30.0 to 30.9 in adult, unspecified obesity type - Starting BMI greater then 30  PLAN:  Pre-Diabetes Bonita QuinLinda will continue to work on weight loss, diet, exercise, and decreasing simple carbohydrates in her diet to help decrease the risk of diabetes. We dicussed metformin including benefits and risks. She was informed that eating too many simple carbohydrates or too many calories at one sitting increases the likelihood of GI side effects. Bonita QuinLinda declined metformin for now and a prescription was not written today. Bonita QuinLinda agrees to follow up with our clinic in 4 weeks as directed to monitor her progress.  We spent > than 50% of the 15 minute visit on the counseling as documented in the note.  Obesity Bonita QuinLinda is currently in the action stage of change. As such, her goal is to continue with weight loss efforts She has agreed to follow the Pescatarian eating plan Bonita QuinLinda has been instructed to work up to a goal of 150 minutes of combined cardio and strengthening exercise per week for weight loss and overall health benefits. We discussed the following Behavioral Modification Strategies today: increasing lean protein intake and planning for success   Bonita QuinLinda has agreed to follow up with our clinic in 4 weeks. She was informed of the importance of frequent follow up visits to maximize her  success with intensive lifestyle modifications for her multiple health conditions.   OBESITY BEHAVIORAL INTERVENTION VISIT  Today's visit was # 14 out of 22.  Starting weight: 202 lbs Starting date: 01/27/17 Today's weight : 155 lbs Today's date: 11/10/2017 Total lbs lost to date: 10747 (Patients must lose 7 lbs in the first 6 months to continue with counseling)   ASK: We discussed the diagnosis of obesity with Pilar JarvisLinda S Hagadorn today and Bonita QuinLinda agreed to give us permission to discuss obesity behavioral modification therapy today.  ASSESS: Bonita QuinLinda has the diagnosis of obesity and her BMI today is 28.34 Bonita QuinLinda is in the action stage of change   ADVISE: Bonita QuinLinda was educated on the multiple health risks of obesity as well as the benefit of weight loss to improve her health. She was advised of the need for long term treatment and the importance of lifestyle modifications.  AGREE: Multiple dietary modification options and treatment options were discussed and  Bonita QuinLinda agreed to the above obesity treatment plan.   Trude McburneyI, Sharon Martin, am acting as transcriptionist for Illa LevelSahar Osman, PA-C I, Illa LevelSahar Osman [facility], have reviewed this note and agree with its content.

## 2017-11-17 ENCOUNTER — Ambulatory Visit: Payer: Medicare Other | Admitting: Cardiology

## 2017-11-17 ENCOUNTER — Encounter: Payer: Self-pay | Admitting: Cardiology

## 2017-11-17 VITALS — BP 116/62 | HR 57 | Ht 62.5 in | Wt 159.0 lb

## 2017-11-17 DIAGNOSIS — E7800 Pure hypercholesterolemia, unspecified: Secondary | ICD-10-CM

## 2017-11-17 DIAGNOSIS — H35033 Hypertensive retinopathy, bilateral: Secondary | ICD-10-CM | POA: Diagnosis not present

## 2017-11-17 DIAGNOSIS — Z961 Presence of intraocular lens: Secondary | ICD-10-CM | POA: Diagnosis not present

## 2017-11-17 DIAGNOSIS — H40013 Open angle with borderline findings, low risk, bilateral: Secondary | ICD-10-CM | POA: Diagnosis not present

## 2017-11-17 DIAGNOSIS — I5032 Chronic diastolic (congestive) heart failure: Secondary | ICD-10-CM | POA: Diagnosis not present

## 2017-11-17 DIAGNOSIS — I10 Essential (primary) hypertension: Secondary | ICD-10-CM | POA: Diagnosis not present

## 2017-11-17 DIAGNOSIS — H35373 Puckering of macula, bilateral: Secondary | ICD-10-CM | POA: Diagnosis not present

## 2017-11-17 DIAGNOSIS — R001 Bradycardia, unspecified: Secondary | ICD-10-CM

## 2017-11-17 MED ORDER — LISINOPRIL 5 MG PO TABS: 5.0000 mg | ORAL_TABLET | Freq: Every day | ORAL | 1 refills | Status: DC

## 2017-11-17 MED ORDER — LOVASTATIN 20 MG PO TABS: 20.0000 mg | ORAL_TABLET | Freq: Every day | ORAL | 1 refills | Status: DC

## 2017-11-17 NOTE — Progress Notes (Signed)
11/17/2017 Jill JarvisLinda S Rosario   [birth year]  [id number]  Primary Physician Pa, [facility] Primary Cardiologist: [name]   Reason for Visit/CC: 6 month F/u for Chronic Diastolic HF  HPI:  Jill CeoLinda S Rosario is a 77 y.o. female, followed by [name], with a history of chronic diastolic CHF, normal LVF, normal coronary arteries by cath 2010, HTN and dyslipidemia. Pt was last seen by [name] June 2018 and was doing well from a cardiac standpoint. She was instructed to return back to see [name] in 12 months, however pt is back today to discuss medication management. Pt recently emailed [name] in Epic regarding decreasing the doses of medications after losing weight.   Pt [facility] [place] [facility]. Her starting weight was 212 lb. She has lost a significant amount of weight through diet and exercise. She is now at 159 lb. Dr. Bernestine Rosario at the weight loss center continues to follow her monthly. Her BP has been much improved, ofter in the low 100s systolic. She had recent FLP 09/2017 that showed an LDL of 90 mg/dL. Hgb A1c was normal at 5.5.   BP today is 116/62 before any medications today. EKG shows mild sinus brady, 57 bpm. Asymptomatic.    Current Meds  Medication Sig  . aspirin 81 MG tablet Take 81 mg by mouth daily.  . furosemide (LASIX) 20 MG tablet Take 1 tablet (20 mg total) by mouth daily.  Marland Kitchen. lisinopril (PRINIVIL,ZESTRIL) 10 MG tablet Take 1 tablet (10 mg total) by mouth daily.  Marland Kitchen. lovastatin (MEVACOR) 40 MG tablet Take 1 tablet (40 mg total) by mouth at bedtime.  . meloxicam (MOBIC) 7.5 MG tablet Take 7.5 mg by mouth daily.   . Multiple Vitamins-Minerals (WOMENS MULTI VITAMIN & MINERAL) TABS Take 1 tablet by mouth daily.  . Omega-3 Fatty Acids (FISH OIL) 1200 MG CAPS Take 1,200 mg by mouth 2 (two) times daily. TOTAL OF 2,400 MG DAILY  . omeprazole (PRILOSEC) 20 MG capsule Take 20 mg by mouth  daily.  Marland Kitchen. zolpidem (AMBIEN) 10 MG tablet Take 5 mg by mouth at bedtime as needed for sleep.    Allergies  Allergen Reactions  . Morphine And Related Nausea And Vomiting   Past Medical History:  Diagnosis Date  . Chronic diastolic CHF (congestive heart failure) (HCC)   . Constipation   . Diastolic dysfunction   . Gastric reflux   . GERD (gastroesophageal reflux disease)   . Hyperlipemia    dyslipidemia  . Hypertension   . Knee pain    bilateral  . Obesity   . Shoulder pain    right shoulder  . SOB (shortness of breath) on exertion   . Swelling    feet and legs   Family History  Problem Relation Age of Onset  . Heart failure Mother   . Diabetes Mother   . Hypertension Mother   . Hyperlipidemia Mother   . Stroke Mother   . Heart disease Mother   . CAD Father   . Heart attack Father   . Hypertension Father   . Hyperlipidemia Father   . Heart disease Father   . Sudden death Father   . CAD Sister   . CAD Brother   . Pancreatic cancer Brother   . CAD Brother   . CAD Brother   . Heart failure Sister   . Other Brother  OPEN HEART  . Other Sister        OPEN HEART   Past Surgical History:  Procedure Laterality Date  . ABDOMINAL HYSTERECTOMY    . APPENDECTOMY  1977  . CARDIAC CATHETERIZATION  02/2009   no evidence of CAD/ Elevated LVEDP at the time ofcath c/w diastolic dysfunction  . KNEE SURGERY  1986  . NECK SURGERY    . OOPHORECTOMY  1972  . SHOULDER ARTHROSCOPY     Social History   Socioeconomic History  . Marital status: Married    Spouse name: [name]  . Number of children: Not on file  . Years of education: Not on file  . Highest education level: Not on file  Social Needs  . Financial resource strain: Not on file  . Food insecurity - worry: Not on file  . Food insecurity - inability: Not on file  . Transportation needs - medical: Not on file  . Transportation needs - non-medical: Not on file  Occupational History  . Occupation: retired -  housekeeper  Tobacco Use  . Smoking status: Never Smoker  . Smokeless tobacco: Never Used  Substance and Sexual Activity  . Alcohol use: No  . Drug use: No  . Sexual activity: No  Other Topics Concern  . Not on file  Social History Narrative  . Not on file     Review of Systems: General: negative for chills, fever, night sweats or weight changes.  Cardiovascular: negative for chest pain, dyspnea on exertion, edema, orthopnea, palpitations, paroxysmal nocturnal dyspnea or shortness of breath Dermatological: negative for rash Respiratory: negative for cough or wheezing Urologic: negative for hematuria Abdominal: negative for nausea, vomiting, diarrhea, bright red blood per rectum, melena, or hematemesis Neurologic: negative for visual changes, syncope, or dizziness All other systems reviewed and are otherwise negative except as noted above.   Physical Exam:  Blood pressure 116/62, pulse (!) 57, height 5' 2.5" (1.588 m), weight 159 lb (72.1 kg).  General appearance: alert, cooperative and no distress Neck: no carotid bruit and no JVD Lungs: clear to auscultation bilaterally Heart: regular rate and rhythm, S1, S2 normal, no murmur, click, rub or gallop Extremities: extremities normal, atraumatic, no cyanosis or edema Pulses: 2+ and symmetric Skin: Skin color, texture, turgor normal. No rashes or lesions Neurologic: Grossly normal  EKG sinus bradycardia 57 bpm -- personally reviewed   ASSESSMENT AND PLAN:   1. Chronic Diastolic HF: volume stable. No dyspnea. Continue Lasix.   2. HTN: much improved after significant weight loss. 116/62 today before any medications. We discussed further management and we will first try to titrate meds down and observer how she does, before stopping completely. She see's Dr. Dalbert Garnet at the weight loss center monthly, who also monitors BP. We will have her reduce dose of lisinopril from 10 to 5 mg. Dr. Dalbert Garnet will reassess at her next f/u.   3.  HLD: recent FLP showed LDL at 90 mg/dL. Pt with significant weight loss and continues to closely monitor her diet/ low fat/low carb and exercises daily. We will reduce dose of Lovastatin down from 40 mg to 20 mg. Get repeat FLP in 3 months.    Follow-Up: return for yearly f/u with Dr. Mayford Knife in June.   Brittainy Delmer Islam, MHS [facility] 11/17/2017 9:09 AM

## 2017-11-17 NOTE — Patient Instructions (Addendum)
Medication Instructions: Your physician has recommended you make the following change in your medication:  -1) DECREASE Lisinopril 5 mg - Take 1 tablet (5 mg) by mouth daily -2) DECREASE Lovastatin 20 mg - Take 1 tablet (20 mg) by mouth daily  Labwork: Your physician recommends that you return for lab work in: 3 MONTHS for FASTING Lipids (same day as follow up appointment)  Procedures/Testing: None Ordered  Follow-Up: Your physician recommends that you schedule a follow-up appointment in: 3 MONTHS with Dr. Mayford Knifeurner   If you need a refill on your cardiac medications before your next appointment, please call your pharmacy.

## 2017-11-25 DIAGNOSIS — H18891 Other specified disorders of cornea, right eye: Secondary | ICD-10-CM | POA: Diagnosis not present

## 2017-11-26 DIAGNOSIS — H5711 Ocular pain, right eye: Secondary | ICD-10-CM | POA: Diagnosis not present

## 2017-11-26 DIAGNOSIS — H18899 Other specified disorders of cornea, unspecified eye: Secondary | ICD-10-CM | POA: Diagnosis not present

## 2017-12-08 ENCOUNTER — Ambulatory Visit (INDEPENDENT_AMBULATORY_CARE_PROVIDER_SITE_OTHER): Payer: Medicare Other | Admitting: Physician Assistant

## 2017-12-08 VITALS — BP 107/66 | HR 56 | Temp 98.1°F | Ht 62.0 in | Wt 153.0 lb

## 2017-12-08 DIAGNOSIS — E669 Obesity, unspecified: Secondary | ICD-10-CM | POA: Diagnosis not present

## 2017-12-08 DIAGNOSIS — I10 Essential (primary) hypertension: Secondary | ICD-10-CM | POA: Diagnosis not present

## 2017-12-08 DIAGNOSIS — Z6830 Body mass index (BMI) 30.0-30.9, adult: Secondary | ICD-10-CM

## 2017-12-08 DIAGNOSIS — R001 Bradycardia, unspecified: Secondary | ICD-10-CM | POA: Diagnosis not present

## 2017-12-08 NOTE — Progress Notes (Signed)
Office: [phone]  /  Fax: [phone]   HPI:   Chief Complaint: OBESITY Bonita QuinLinda is here to discuss her progress with her obesity treatment plan. She is on the Pescatarian eating plan and is following her eating plan approximately 50 % of the time. She states she is walking for 60 minutes 7 times per week. Bonita QuinLinda continues to do well with weight loss. She is working on maintaining her weight, as she has lost 49 pounds with us. She did not enjoy Pescatarian meal plan and requests to stay on Category 2 plan. Her weight is 153 lb (69.4 kg) today and has had a weight loss of 2 pounds over a period of 3 to 4 weeks since her last visit. She has lost 49 lbs since starting treatment with us.  Hypertension Francoise CeoLinda S Arseneault is a 77 y.o. female with hypertension. Kayliah's blood pressure is stable, on low end. Lisinopril decreased to 5 mg and she denies chest pain, shortness of breath, or dizziness. She is working weight loss to help control her blood pressure with the goal of decreasing her risk of heart attack and stroke. Riya's blood pressure is currently controlled.  Bradycardia HR at 57. Pt is not on any rate controlling medicines. She is on lisinopril 5 mg, recently decreased from 10 mg. Pt recent visit with Cardiologist for medication adjustment and EKG in their office, according to record review, with sinus bradycardia. She denies any  Dizziness, fatigue, palpitations.    ALLERGIES: Allergies  Allergen Reactions  . Morphine And Related Nausea And Vomiting    MEDICATIONS: Current Outpatient Medications on File Prior to Visit  Medication Sig Dispense Refill  . aspirin 81 MG tablet Take 81 mg by mouth daily.    . furosemide (LASIX) 20 MG tablet Take 1 tablet (20 mg total) by mouth daily. 90 tablet 0  . lisinopril (PRINIVIL,ZESTRIL) 5 MG tablet Take 1 tablet (5 mg total) by mouth daily. 90 tablet 1  . lovastatin (MEVACOR) 20 MG tablet Take 1 tablet (20 mg total) by mouth at bedtime. 90  tablet 1  . meloxicam (MOBIC) 7.5 MG tablet Take 7.5 mg by mouth daily.     . Multiple Vitamins-Minerals (WOMENS MULTI VITAMIN & MINERAL) TABS Take 1 tablet by mouth daily.    . Omega-3 Fatty Acids (FISH OIL) 1200 MG CAPS Take 1,200 mg by mouth 2 (two) times daily. TOTAL OF 2,400 MG DAILY    . omeprazole (PRILOSEC) 20 MG capsule Take 20 mg by mouth daily.    Marland Kitchen. zolpidem (AMBIEN) 10 MG tablet Take 5 mg by mouth at bedtime as needed for sleep.     . [DISCONTINUED] Omeprazole (PRILOSEC PO) Take 20 mg by mouth daily.      No current facility-administered medications on file prior to visit.     PAST MEDICAL HISTORY: Past Medical History:  Diagnosis Date  . Chronic diastolic CHF (congestive heart failure) (HCC)   . Constipation   . Diastolic dysfunction   . Gastric reflux   . GERD (gastroesophageal reflux disease)   . Hyperlipemia    dyslipidemia  . Hypertension   . Knee pain    bilateral  . Obesity   . Shoulder pain    right shoulder  . SOB (shortness of breath) on exertion   . Swelling    feet and legs    PAST SURGICAL HISTORY: Past Surgical History:  Procedure Laterality Date  . ABDOMINAL HYSTERECTOMY    . APPENDECTOMY  1977  . CARDIAC  CATHETERIZATION  02/2009   no evidence of CAD/ Elevated LVEDP at the time ofcath c/w diastolic dysfunction  . KNEE SURGERY  1986  . NECK SURGERY    . OOPHORECTOMY  1972  . SHOULDER ARTHROSCOPY      SOCIAL HISTORY: Social History   Tobacco Use  . Smoking status: Never Smoker  . Smokeless tobacco: Never Used  Substance Use Topics  . Alcohol use: No  . Drug use: No    FAMILY HISTORY: Family History  Problem Relation Age of Onset  . Heart failure Mother   . Diabetes Mother   . Hypertension Mother   . Hyperlipidemia Mother   . Stroke Mother   . Heart disease Mother   . CAD Father   . Heart attack Father   . Hypertension Father   . Hyperlipidemia Father   . Heart disease Father   . Sudden death Father   . CAD Sister   . CAD  Brother   . Pancreatic cancer Brother   . CAD Brother   . CAD Brother   . Heart failure Sister   . Other Brother        OPEN HEART  . Other Sister        OPEN HEART    ROS: Review of Systems  Constitutional: Positive for weight loss. Negative for malaise/fatigue.  Respiratory: Negative for shortness of breath.   Cardiovascular: Negative for chest pain and palpitations.  Neurological: Negative for dizziness.    PHYSICAL EXAM: Blood pressure 107/66, pulse (!) 56, temperature 98.1 F (36.7 C), temperature source Oral, height (!) 5.2" (0.132 m), weight 153 lb (69.4 kg), SpO2 98 %. Body mass index is 3,978.21 kg/m. Physical Exam  Constitutional: She is oriented to person, place, and time. She appears well-developed and well-nourished.  Cardiovascular: Bradycardia present.  Pulmonary/Chest: Effort normal.  Musculoskeletal: Normal range of motion.  Neurological: She is oriented to person, place, and time.  Skin: Skin is warm and dry.  Psychiatric: She has a normal mood and affect. Her behavior is normal.  Vitals reviewed.   RECENT LABS AND TESTS: BMET    Component Value Date/Time   NA 142 09/22/2017 0929   K 4.7 09/22/2017 0929   CL 101 09/22/2017 0929   CO2 26 09/22/2017 0929   GLUCOSE 96 09/22/2017 0929   GLUCOSE 104 (H) 03/18/2016 1000   BUN 26 09/22/2017 0929   CREATININE 0.75 09/22/2017 0929   CREATININE 0.70 03/18/2016 1000   CALCIUM 9.6 09/22/2017 0929   GFRNONAA 79 09/22/2017 0929   GFRAA 91 09/22/2017 0929   Lab Results  Component Value Date   HGBA1C 5.5 09/22/2017   HGBA1C 5.4 05/26/2017   HGBA1C 5.7 (H) 01/27/2017   Lab Results  Component Value Date   INSULIN 6.2 09/22/2017   INSULIN 13.1 05/26/2017   INSULIN 16.7 01/27/2017   CBC    Component Value Date/Time   WBC 7.3 01/27/2017 1040   WBC 10.0 04/22/2011 1439   RBC 4.31 01/27/2017 1040   RBC 4.08 04/22/2011 1439   HGB 13.4 01/27/2017 1040   HCT 41.1 01/27/2017 1040   PLT 231 01/27/2017  1040   MCV 95 01/27/2017 1040   MCH 31.1 01/27/2017 1040   MCH 31.9 04/22/2011 1439   MCHC 32.6 01/27/2017 1040   MCHC 34.6 04/22/2011 1439   RDW 13.3 01/27/2017 1040   LYMPHSABS 2.1 01/27/2017 1040   MONOABS 0.5 04/22/2011 1439   EOSABS 0.1 01/27/2017 1040   BASOSABS 0.0 01/27/2017 1040  Iron/TIBC/Ferritin/ %Sat No results found for: IRON, TIBC, FERRITIN, IRONPCTSAT Lipid Panel     Component Value Date/Time   CHOL 154 09/22/2017 0929   TRIG 118 09/22/2017 0929   HDL 40 09/22/2017 0929   CHOLHDL 4.5 03/18/2016 1000   VLDL 25 03/18/2016 1000   LDLCALC 90 09/22/2017 0929   Hepatic Function Panel     Component Value Date/Time   PROT 7.1 09/22/2017 0929   ALBUMIN 4.9 (H) 09/22/2017 0929   AST 27 09/22/2017 0929   ALT 23 09/22/2017 0929   ALKPHOS 83 09/22/2017 0929   BILITOT 0.5 09/22/2017 0929   BILIDIR 0.1 02/07/2015 1521      Component Value Date/Time   TSH 1.710 01/27/2017 1040    ASSESSMENT AND PLAN: Essential hypertension  Class 1 obesity with serious comorbidity and body mass index (BMI) of 30.0 to 30.9 in adult, unspecified obesity type - Starting BMI greater then 30  PLAN:  Hypertension We discussed sodium restriction, working on healthy weight loss, and a regular exercise program as the means to achieve improved blood pressure control. Breia agreed with this plan and agreed to follow up as directed. We will continue to monitor her blood pressure as well as her progress with the above lifestyle modifications. She will continue her medications as prescribed and will watch for signs of hypotension as she continues her lifestyle modifications. Nikkita agrees to follow up with our clinic in 4 weeks.  Bradycardia Patient advised is she develops any symptoms to follow up with her cardiologist. She agrees to follow up with [place] in 4 weeks.    We spent > than 50% of the 15 minute visit on the counseling as documented in the note.  Obesity Letricia is currently in the  action stage of change. As such, her goal is to continue with weight loss efforts She has agreed to follow the Category 2 plan Marquesa has been instructed to work up to a goal of 150 minutes of combined cardio and strengthening exercise per week for weight loss and overall health benefits. We discussed the following Behavioral Modification Strategies today: increasing lean protein intake and work on meal planning and easy cooking plans   Zelda has agreed to follow up with our clinic in 4 weeks. She was informed of the importance of frequent follow up visits to maximize her success with intensive lifestyle modifications for her multiple health conditions.   OBESITY BEHAVIORAL INTERVENTION VISIT  Today's visit was # 15 out of 22.  Starting weight: 202 lbs Starting date: 01/27/17 Today's weight : 153 lbs  Today's date: 12/08/2017 Total lbs lost to date: 79 (Patients must lose 7 lbs in the first 6 months to continue with counseling)   ASK: We discussed the diagnosis of obesity with Pilar Jarvis Mairena today and Summer agreed to give [place] permission to discuss obesity behavioral modification therapy today.  ASSESS: Dymphna has the diagnosis of obesity and her BMI today is 27.98 Hadley is in the action stage of change   ADVISE: Jaira was educated on the multiple health risks of obesity as well as the benefit of weight loss to improve her health. She was advised of the need for long term treatment and the importance of lifestyle modifications.  AGREE: Multiple dietary modification options and treatment options were discussed and  Leyna agreed to the above obesity treatment plan.   Trude Mcburney, am acting as transcriptionist for Illa Level, PA-C I, Illa Level [facility], have reviewed this note and agree with  its content

## 2018-01-05 ENCOUNTER — Encounter (INDEPENDENT_AMBULATORY_CARE_PROVIDER_SITE_OTHER): Payer: Self-pay

## 2018-01-05 ENCOUNTER — Ambulatory Visit (INDEPENDENT_AMBULATORY_CARE_PROVIDER_SITE_OTHER): Payer: Medicare Other | Admitting: Family Medicine

## 2018-01-14 DIAGNOSIS — M9904 Segmental and somatic dysfunction of sacral region: Secondary | ICD-10-CM | POA: Diagnosis not present

## 2018-01-14 DIAGNOSIS — M5136 Other intervertebral disc degeneration, lumbar region: Secondary | ICD-10-CM | POA: Diagnosis not present

## 2018-01-14 DIAGNOSIS — M5137 Other intervertebral disc degeneration, lumbosacral region: Secondary | ICD-10-CM | POA: Diagnosis not present

## 2018-01-14 DIAGNOSIS — M9903 Segmental and somatic dysfunction of lumbar region: Secondary | ICD-10-CM | POA: Diagnosis not present

## 2018-01-16 ENCOUNTER — Other Ambulatory Visit: Payer: Self-pay | Admitting: Cardiology

## 2018-01-16 NOTE — Telephone Encounter (Signed)
  Brittainy Delmer Islam, MHS [facility] 11/17/2017 9:09 AM       Patient Instructions by Rebbeca Paul, CMA at 11/17/2017 9:00 AM   Author: Rebbeca Paul, CMA Author Type: Certified Medical Assistant Filed: 11/17/2017 9:35 AM  Note Status: Addendum Cosign: Cosign Not Required Encounter Date: 11/17/2017  Editor: Rebbeca Paul, CMA (Certified Medical Assistant)  Prior Versions: 1. Rebbeca Paul, CMA (Certified Medical Assistant) at 11/17/2017 9:35 AM - Signed    Medication Instructions: Your physician has recommended you make the following change in your medication:  -1) DECREASE Lisinopril 5 mg - Take 1 tablet (5 mg) by mouth daily -2) DECREASE Lovastatin 20 mg - Take 1 tablet (20 mg) by mouth daily

## 2018-01-19 DIAGNOSIS — M5136 Other intervertebral disc degeneration, lumbar region: Secondary | ICD-10-CM | POA: Diagnosis not present

## 2018-01-19 DIAGNOSIS — M5137 Other intervertebral disc degeneration, lumbosacral region: Secondary | ICD-10-CM | POA: Diagnosis not present

## 2018-01-19 DIAGNOSIS — M9904 Segmental and somatic dysfunction of sacral region: Secondary | ICD-10-CM | POA: Diagnosis not present

## 2018-01-19 DIAGNOSIS — M9903 Segmental and somatic dysfunction of lumbar region: Secondary | ICD-10-CM | POA: Diagnosis not present

## 2018-01-23 DIAGNOSIS — M9904 Segmental and somatic dysfunction of sacral region: Secondary | ICD-10-CM | POA: Diagnosis not present

## 2018-01-23 DIAGNOSIS — M5137 Other intervertebral disc degeneration, lumbosacral region: Secondary | ICD-10-CM | POA: Diagnosis not present

## 2018-01-23 DIAGNOSIS — M9903 Segmental and somatic dysfunction of lumbar region: Secondary | ICD-10-CM | POA: Diagnosis not present

## 2018-01-23 DIAGNOSIS — M5136 Other intervertebral disc degeneration, lumbar region: Secondary | ICD-10-CM | POA: Diagnosis not present

## 2018-02-23 ENCOUNTER — Other Ambulatory Visit: Payer: Medicare Other | Admitting: *Deleted

## 2018-02-23 ENCOUNTER — Encounter: Payer: Self-pay | Admitting: Cardiology

## 2018-02-23 ENCOUNTER — Ambulatory Visit: Payer: Medicare Other | Admitting: Cardiology

## 2018-02-23 VITALS — BP 130/66 | HR 53 | Ht 62.0 in | Wt 160.0 lb

## 2018-02-23 DIAGNOSIS — E7800 Pure hypercholesterolemia, unspecified: Secondary | ICD-10-CM | POA: Diagnosis not present

## 2018-02-23 DIAGNOSIS — I5032 Chronic diastolic (congestive) heart failure: Secondary | ICD-10-CM

## 2018-02-23 DIAGNOSIS — I10 Essential (primary) hypertension: Secondary | ICD-10-CM | POA: Diagnosis not present

## 2018-02-23 DIAGNOSIS — R001 Bradycardia, unspecified: Secondary | ICD-10-CM | POA: Diagnosis not present

## 2018-02-23 LAB — LIPID PANEL
Chol/HDL Ratio: 3.3 ratio (ref 0.0–4.4)
Cholesterol, Total: 147 mg/dL (ref 100–199)
HDL: 45 mg/dL (ref >39)
LDL Calculated: 86 mg/dL (ref 0–99)
Triglycerides: 80 mg/dL (ref 0–149)
VLDL Cholesterol Cal: 16 mg/dL (ref 5–40)

## 2018-02-23 LAB — ALT: ALT: 23 IU/L (ref 0–32)

## 2018-02-23 NOTE — Progress Notes (Signed)
Cardiology Office Note:    Date:  02/23/2018   ID:  Jill CeoLinda S Dungan, DOB 01-Apr-1941, MRN [id number]  PCP:  Lenell AntuLe, Thao P, DO  Cardiologist:  No primary care provider on file.    Referring MD: Trey SailorsPa, [facility]*   Chief Complaint  Patient presents with  . Congestive Heart Failure  . Hypertension    History of Present Illness:    [name] is a 77 y.o. female with a hx of chronic diastolic CHF, normal LVF, normal coronary arteries by cath 2010, HTN and dyslipidemia.She is here today for followup and is doing well.  She denies any chest pain or pressure, SOB, DOE, PND, orthopnea, LE edema, dizziness, palpitations or syncope. she is compliant with her meds and is tolerating meds with no SE.      Past Medical History:  Diagnosis Date  . Bradycardia 02/28/2017  . Chronic diastolic CHF (congestive heart failure) (HCC)   . Constipation   . Diastolic dysfunction   . Essential hypertension 05/06/2017  . Essential hypertension, benign 01/05/2014  . Gastric reflux   . GERD (gastroesophageal reflux disease)   . Hyperlipemia    dyslipidemia  . Hypertension   . Knee pain    bilateral  . Obesity   . Shoulder pain    right shoulder  . SOB (shortness of breath) on exertion   . Swelling    feet and legs  . Vitamin D deficiency 05/06/2017    Past Surgical History:  Procedure Laterality Date  . ABDOMINAL HYSTERECTOMY    . APPENDECTOMY  1977  . CARDIAC CATHETERIZATION  02/2009   no evidence of CAD/ Elevated LVEDP at the time ofcath c/w diastolic dysfunction  . KNEE SURGERY  1986  . NECK SURGERY    . OOPHORECTOMY  1972  . SHOULDER ARTHROSCOPY      Current Medications: Current Meds  Medication Sig  . aspirin 81 MG tablet Take 81 mg by mouth daily.  . furosemide (LASIX) 20 MG tablet Take 1 tablet (20 mg total) by mouth daily.  Marland Kitchen. lisinopril (PRINIVIL,ZESTRIL) 10 MG tablet Take 10 mg by mouth daily.  Marland Kitchen. lovastatin (MEVACOR) 20 MG tablet Take 1 tablet (20 mg total) by  mouth at bedtime.  . meloxicam (MOBIC) 7.5 MG tablet Take 7.5 mg by mouth daily.   . Multiple Vitamins-Minerals (WOMENS MULTI VITAMIN & MINERAL) TABS Take 1 tablet by mouth daily.  . Omega-3 Fatty Acids (FISH OIL) 1200 MG CAPS Take 1,200 mg by mouth 2 (two) times daily. TOTAL OF 2,400 MG DAILY  . omeprazole (PRILOSEC) 20 MG capsule Take 20 mg by mouth daily.  Marland Kitchen. zolpidem (AMBIEN) 10 MG tablet Take 5 mg by mouth at bedtime as needed for sleep.      Allergies:   Morphine and related   Social History   Socioeconomic History  . Marital status: Married    Spouse name: Homero FellersFrank  . Number of children: Not on file  . Years of education: Not on file  . Highest education level: Not on file  Occupational History  . Occupation: retired - housekeeper  Social Needs  . Financial resource strain: Not on file  . Food insecurity:    Worry: Not on file    Inability: Not on file  . Transportation needs:    Medical: Not on file    Non-medical: Not on file  Tobacco Use  . Smoking status: Never Smoker  . Smokeless tobacco: Never Used  Substance and Sexual Activity  .  Alcohol use: No  . Drug use: No  . Sexual activity: Never  Lifestyle  . Physical activity:    Days per week: Not on file    Minutes per session: Not on file  . Stress: Not on file  Relationships  . Social connections:    Talks on phone: Not on file    Gets together: Not on file    Attends religious service: Not on file    Active member of club or organization: Not on file    Attends meetings of clubs or organizations: Not on file    Relationship status: Not on file  Other Topics Concern  . Not on file  Social History Narrative  . Not on file     Family History: The patient's family history includes CAD in her brother, brother, brother, father, and sister; Diabetes in her mother; Heart attack in her father; Heart disease in her father and mother; Heart failure in her mother and sister; Hyperlipidemia in her father and mother;  Hypertension in her father and mother; Other in her brother and sister; Pancreatic cancer in her brother; Stroke in her mother; Sudden death in her father.  ROS:   Please see the history of present illness.    ROS  All other systems reviewed and negative.   EKGs/Labs/Other Studies Reviewed:    The following studies were reviewed today: none  EKG:  EKG is not ordered today.    Recent Labs: 09/22/2017: ALT 23; BUN 26; Creatinine, Ser 0.75; Potassium 4.7; Sodium 142   Recent Lipid Panel    Component Value Date/Time   CHOL 154 09/22/2017 0929   TRIG 118 09/22/2017 0929   HDL 40 09/22/2017 0929   CHOLHDL 4.5 03/18/2016 1000   VLDL 25 03/18/2016 1000   LDLCALC 90 09/22/2017 0929    Physical Exam:    VS:  BP 130/66   Pulse (!) 53   Ht 5\' 2"  (1.575 m)   Wt 160 lb (72.6 kg)   SpO2 98%   BMI 29.26 kg/m     Wt Readings from Last 3 Encounters:  02/23/18 160 lb (72.6 kg)  12/08/17 153 lb (69.4 kg)  11/17/17 159 lb (72.1 kg)     GEN:  Well nourished, well developed in no acute distress HEENT: Normal NECK: No JVD; No carotid bruits LYMPHATICS: No lymphadenopathy CARDIAC: RRR, no murmurs, rubs, gallops RESPIRATORY:  Clear to auscultation without rales, wheezing or rhonchi  ABDOMEN: Soft, non-tender, non-distended MUSCULOSKELETAL:  No edema; No deformity  SKIN: Warm and dry NEUROLOGIC:  Alert and oriented x 3 PSYCHIATRIC:  Normal affect   ASSESSMENT:    1. Chronic diastolic CHF (congestive heart failure) (HCC)   2. Essential hypertension, benign   3. Bradycardia   4. Pure hypercholesterolemia    PLAN:    In order of problems listed above:  1.  Chronic diastolic CHF - she appears euvolemic on exam today.  Her weight is up 7 lbs but does not appear to be fluid.  She will continue on Lasix 20 mg daily.  2.  Hypertension -BP is well controlled on exam today.  She will continue on lisinopril 10 mg daily.  Creatinine was stable at 0.75 on 09/22/2017.  Potassium was  4.7.  3.  Bradycardia - She is asymptomatic.    4.  Hyperlipidemia - she has FLP and ALT pending today.  She will continue on lovastatin 20mg  daily.     Medication Adjustments/Labs and Tests Ordered: Current medicines are reviewed  at length with the patient today.  Concerns regarding medicines are outlined above.  Orders Placed This Encounter  Procedures  . ALT   No orders of the defined types were placed in this encounter.   Signed, Armanda Magic, MD  02/23/2018 9:37 AM    [facility]

## 2018-02-23 NOTE — Patient Instructions (Signed)
Medication Instructions:  Your physician recommends that you continue on your current medications as directed. Please refer to the Current Medication list given to you today.  If you need a refill on your cardiac medications, please contact your pharmacy first.  Labwork: Today for Lipid panel and ALT   Testing/Procedures: None ordered   Follow-Up: Your physician wants you to follow-up in: 1 year with Dr. Mayford Knifeurner. You will receive a reminder letter in the mail two months in advance. If you don't receive a letter, please call our office to schedule the follow-up appointment.  Any Other Special Instructions Will Be Listed Below (If Applicable).   [facility]    Lyda PeroneRena Donnelle Rubey, RN  [phone]  If you need a refill on your cardiac medications before your next appointment, please call your pharmacy.

## 2018-02-25 ENCOUNTER — Telehealth: Payer: Self-pay | Admitting: Cardiology

## 2018-02-25 NOTE — Telephone Encounter (Signed)
New message ° ° ° °Patient calling for lab results °

## 2018-02-27 NOTE — Telephone Encounter (Signed)
Left message to call back for lab results.

## 2018-03-03 ENCOUNTER — Telehealth: Payer: Self-pay | Admitting: Cardiology

## 2018-03-03 NOTE — Telephone Encounter (Signed)
Returned call to patient. Informed her per [facility] MediciBrittany Simmons, PA Cholesterol levels look well controlled still after decreasing the dose of lovastatin several months ago. Liver enzyme also normal. Ok to continue current dose  Pt verbalized understanding and thankful for the call

## 2018-03-03 NOTE — Telephone Encounter (Signed)
New message ° ° ° °Pt is returning call about lab results. °

## 2018-04-08 ENCOUNTER — Other Ambulatory Visit: Payer: Self-pay | Admitting: Cardiology

## 2018-04-15 ENCOUNTER — Other Ambulatory Visit: Payer: Self-pay | Admitting: Cardiology

## 2018-04-15 MED ORDER — LISINOPRIL 10 MG PO TABS: 10.0000 mg | ORAL_TABLET | Freq: Every day | ORAL | 3 refills | Status: DC

## 2018-05-13 ENCOUNTER — Other Ambulatory Visit: Payer: Self-pay | Admitting: Cardiology

## 2018-05-18 DIAGNOSIS — K219 Gastro-esophageal reflux disease without esophagitis: Secondary | ICD-10-CM | POA: Diagnosis not present

## 2018-05-18 DIAGNOSIS — E785 Hyperlipidemia, unspecified: Secondary | ICD-10-CM | POA: Diagnosis not present

## 2018-05-18 DIAGNOSIS — Z1211 Encounter for screening for malignant neoplasm of colon: Secondary | ICD-10-CM | POA: Diagnosis not present

## 2018-05-18 DIAGNOSIS — Z0000 Encounter for general adult medical examination without abnormal findings: Secondary | ICD-10-CM | POA: Diagnosis not present

## 2018-05-18 DIAGNOSIS — Z124 Encounter for screening for malignant neoplasm of cervix: Secondary | ICD-10-CM | POA: Diagnosis not present

## 2018-05-19 DIAGNOSIS — M1711 Unilateral primary osteoarthritis, right knee: Secondary | ICD-10-CM | POA: Diagnosis not present

## 2018-05-19 DIAGNOSIS — M84350A Stress fracture, pelvis, initial encounter for fracture: Secondary | ICD-10-CM | POA: Diagnosis not present

## 2018-05-22 DIAGNOSIS — M25511 Pain in right shoulder: Secondary | ICD-10-CM | POA: Diagnosis not present

## 2018-05-31 ENCOUNTER — Observation Stay (HOSPITAL_COMMUNITY)
Admission: EM | Admit: 2018-05-31 | Discharge: 2018-06-01 | Disposition: A | Discharge status: Home / Self Care | Payer: Medicare Other | Attending: Internal Medicine | Admitting: Internal Medicine

## 2018-05-31 ENCOUNTER — Other Ambulatory Visit: Payer: Self-pay

## 2018-05-31 ENCOUNTER — Emergency Department (HOSPITAL_COMMUNITY): Payer: Medicare Other

## 2018-05-31 ENCOUNTER — Encounter (HOSPITAL_COMMUNITY): Payer: Self-pay | Admitting: Emergency Medicine

## 2018-05-31 DIAGNOSIS — Z885 Allergy status to narcotic agent status: Secondary | ICD-10-CM | POA: Diagnosis not present

## 2018-05-31 DIAGNOSIS — K5792 Diverticulitis of intestine, part unspecified, without perforation or abscess without bleeding: Secondary | ICD-10-CM | POA: Diagnosis present

## 2018-05-31 DIAGNOSIS — E785 Hyperlipidemia, unspecified: Secondary | ICD-10-CM | POA: Insufficient documentation

## 2018-05-31 DIAGNOSIS — R110 Nausea: Secondary | ICD-10-CM | POA: Diagnosis not present

## 2018-05-31 DIAGNOSIS — I5032 Chronic diastolic (congestive) heart failure: Secondary | ICD-10-CM | POA: Insufficient documentation

## 2018-05-31 DIAGNOSIS — E669 Obesity, unspecified: Secondary | ICD-10-CM | POA: Insufficient documentation

## 2018-05-31 DIAGNOSIS — Z9889 Other specified postprocedural states: Secondary | ICD-10-CM | POA: Diagnosis not present

## 2018-05-31 DIAGNOSIS — Z8249 Family history of ischemic heart disease and other diseases of the circulatory system: Secondary | ICD-10-CM | POA: Insufficient documentation

## 2018-05-31 DIAGNOSIS — Z79899 Other long term (current) drug therapy: Secondary | ICD-10-CM | POA: Insufficient documentation

## 2018-05-31 DIAGNOSIS — I700 Atherosclerosis of aorta: Secondary | ICD-10-CM | POA: Diagnosis not present

## 2018-05-31 DIAGNOSIS — K6389 Other specified diseases of intestine: Secondary | ICD-10-CM | POA: Diagnosis not present

## 2018-05-31 DIAGNOSIS — I110 Hypertensive heart disease with heart failure: Secondary | ICD-10-CM | POA: Diagnosis not present

## 2018-05-31 DIAGNOSIS — E559 Vitamin D deficiency, unspecified: Secondary | ICD-10-CM | POA: Diagnosis not present

## 2018-05-31 DIAGNOSIS — I959 Hypotension, unspecified: Secondary | ICD-10-CM | POA: Diagnosis not present

## 2018-05-31 DIAGNOSIS — Z823 Family history of stroke: Secondary | ICD-10-CM | POA: Diagnosis not present

## 2018-05-31 DIAGNOSIS — K219 Gastro-esophageal reflux disease without esophagitis: Secondary | ICD-10-CM | POA: Diagnosis not present

## 2018-05-31 DIAGNOSIS — R55 Syncope and collapse: Secondary | ICD-10-CM | POA: Diagnosis not present

## 2018-05-31 DIAGNOSIS — Z7982 Long term (current) use of aspirin: Secondary | ICD-10-CM | POA: Diagnosis not present

## 2018-05-31 DIAGNOSIS — Z6829 Body mass index (BMI) 29.0-29.9, adult: Secondary | ICD-10-CM | POA: Insufficient documentation

## 2018-05-31 DIAGNOSIS — Z955 Presence of coronary angioplasty implant and graft: Secondary | ICD-10-CM | POA: Insufficient documentation

## 2018-05-31 DIAGNOSIS — R52 Pain, unspecified: Secondary | ICD-10-CM | POA: Diagnosis not present

## 2018-05-31 DIAGNOSIS — R42 Dizziness and giddiness: Secondary | ICD-10-CM | POA: Diagnosis not present

## 2018-05-31 DIAGNOSIS — I10 Essential (primary) hypertension: Secondary | ICD-10-CM | POA: Diagnosis present

## 2018-05-31 DIAGNOSIS — Z90710 Acquired absence of both cervix and uterus: Secondary | ICD-10-CM | POA: Diagnosis not present

## 2018-05-31 LAB — CBC WITH DIFFERENTIAL/PLATELET
ABS IMMATURE GRANULOCYTES: 0.2 10*3/uL — AB (ref 0.0–0.1)
Basophils Absolute: 0.1 10*3/uL (ref 0.0–0.1)
Basophils Relative: 0 %
EOS PCT: 0 %
Eosinophils Absolute: 0.1 10*3/uL (ref 0.0–0.7)
HEMATOCRIT: 42.3 % (ref 36.0–46.0)
HEMOGLOBIN: 13.9 g/dL (ref 12.0–15.0)
Immature Granulocytes: 1 %
LYMPHS ABS: 3.6 10*3/uL (ref 0.7–4.0)
LYMPHS PCT: 15 %
MCH: 31.4 pg (ref 26.0–34.0)
MCHC: 32.9 g/dL (ref 30.0–36.0)
MCV: 95.7 fL (ref 78.0–100.0)
Monocytes Absolute: 1.3 10*3/uL — ABNORMAL HIGH (ref 0.1–1.0)
Monocytes Relative: 5 %
NEUTROS ABS: 19.1 10*3/uL — AB (ref 1.7–7.7)
Neutrophils Relative %: 79 %
Platelets: 218 10*3/uL (ref 150–400)
RBC: 4.42 MIL/uL (ref 3.87–5.11)
RDW: 13.2 % (ref 11.5–15.5)
WBC: 24.3 10*3/uL — AB (ref 4.0–10.5)

## 2018-05-31 LAB — COMPREHENSIVE METABOLIC PANEL
ALT: 29 U/L (ref 0–44)
ANION GAP: 15 (ref 5–15)
AST: 22 U/L (ref 15–41)
Albumin: 4.2 g/dL (ref 3.5–5.0)
Alkaline Phosphatase: 61 U/L (ref 38–126)
BUN: 21 mg/dL (ref 8–23)
CHLORIDE: 97 mmol/L — AB (ref 98–111)
CO2: 25 mmol/L (ref 22–32)
Calcium: 9.9 mg/dL (ref 8.9–10.3)
Creatinine, Ser: 0.85 mg/dL (ref 0.44–1.00)
GFR calc Af Amer: >60 mL/min (ref >60)
Glucose, Bld: 112 mg/dL — ABNORMAL HIGH (ref 70–99)
POTASSIUM: 3.5 mmol/L (ref 3.5–5.1)
Sodium: 137 mmol/L (ref 135–145)
Total Bilirubin: 1.2 mg/dL (ref 0.3–1.2)
Total Protein: 6.9 g/dL (ref 6.5–8.1)

## 2018-05-31 LAB — TROPONIN I: Troponin I: <0.03 ng/mL (ref <0.03)

## 2018-05-31 MED ORDER — FENTANYL CITRATE (PF) 100 MCG/2ML IJ SOLN
INTRAMUSCULAR | Status: AC
  Filled 2018-05-31: qty 2

## 2018-05-31 MED ORDER — PIPERACILLIN-TAZOBACTAM 3.375 G IVPB 30 MIN
3.3750 g | Freq: Once | INTRAVENOUS | Status: AC
  Administered 2018-05-31: 3.375 g via INTRAVENOUS
  Filled 2018-05-31: qty 50

## 2018-05-31 MED ORDER — IOHEXOL 300 MG/ML  SOLN
100.0000 mL | Freq: Once | INTRAMUSCULAR | Status: AC | PRN
  Administered 2018-05-31: 100 mL via INTRAVENOUS

## 2018-05-31 MED ORDER — FENTANYL CITRATE (PF) 100 MCG/2ML IJ SOLN
50.0000 ug | Freq: Once | INTRAMUSCULAR | Status: AC
  Administered 2018-05-31: 50 ug via INTRAVENOUS
  Filled 2018-05-31 (×2): qty 2

## 2018-05-31 MED ORDER — ONDANSETRON HCL 4 MG/2ML IJ SOLN
INTRAMUSCULAR | Status: AC
  Administered 2018-05-31: 4 mg
  Filled 2018-05-31: qty 2

## 2018-05-31 MED ORDER — ASPIRIN EC 81 MG PO TBEC: 81.0000 mg | DELAYED_RELEASE_TABLET | Freq: Every day | ORAL | Status: DC

## 2018-05-31 MED ORDER — SODIUM CHLORIDE 0.9 % IV BOLUS
500.0000 mL | Freq: Once | INTRAVENOUS | Status: AC
  Administered 2018-05-31: 500 mL via INTRAVENOUS

## 2018-05-31 MED ORDER — ADULT MULTIVITAMIN W/MINERALS CH
1.0000 | ORAL_TABLET | Freq: Every day | ORAL | Status: DC
  Administered 2018-06-01: 1 via ORAL
  Filled 2018-05-31: qty 1

## 2018-05-31 MED ORDER — HYDRALAZINE HCL 20 MG/ML IJ SOLN: 5.0000 mg | INTRAMUSCULAR | Status: DC | PRN

## 2018-05-31 MED ORDER — FENTANYL CITRATE (PF) 100 MCG/2ML IJ SOLN
25.0000 ug | INTRAMUSCULAR | Status: DC | PRN
  Filled 2018-05-31: qty 2

## 2018-05-31 MED ORDER — ONDANSETRON HCL 4 MG/2ML IJ SOLN: 4.0000 mg | Freq: Four times a day (QID) | INTRAMUSCULAR | Status: DC | PRN

## 2018-05-31 MED ORDER — HYDROMORPHONE HCL 1 MG/ML IJ SOLN
0.5000 mg | Freq: Once | INTRAMUSCULAR | Status: DC
  Filled 2018-05-31: qty 1

## 2018-05-31 MED ORDER — OMEGA-3-ACID ETHYL ESTERS 1 G PO CAPS
1.0000 g | ORAL_CAPSULE | Freq: Two times a day (BID) | ORAL | Status: DC
  Administered 2018-06-01: 1 g via ORAL
  Filled 2018-05-31: qty 1

## 2018-05-31 MED ORDER — SODIUM CHLORIDE 0.9 % IV SOLN
INTRAVENOUS | Status: AC
  Administered 2018-05-31: 22:00:00 via INTRAVENOUS

## 2018-05-31 MED ORDER — PANTOPRAZOLE SODIUM 40 MG PO TBEC
40.0000 mg | DELAYED_RELEASE_TABLET | Freq: Every day | ORAL | Status: DC
  Administered 2018-06-01: 40 mg via ORAL
  Filled 2018-05-31: qty 1

## 2018-05-31 MED ORDER — ZOLPIDEM TARTRATE 5 MG PO TABS: 5.0000 mg | ORAL_TABLET | Freq: Every evening | ORAL | Status: DC | PRN

## 2018-05-31 MED ORDER — ACETAMINOPHEN 325 MG PO TABS: 650.0000 mg | ORAL_TABLET | Freq: Four times a day (QID) | ORAL | Status: DC | PRN

## 2018-05-31 MED ORDER — ACETAMINOPHEN 650 MG RE SUPP: 650.0000 mg | Freq: Four times a day (QID) | RECTAL | Status: DC | PRN

## 2018-05-31 MED ORDER — FENTANYL CITRATE (PF) 100 MCG/2ML IJ SOLN
50.0000 ug | Freq: Once | INTRAMUSCULAR | Status: AC
  Administered 2018-05-31: 50 ug via INTRAVENOUS

## 2018-05-31 MED ORDER — ONDANSETRON HCL 4 MG PO TABS: 4.0000 mg | ORAL_TABLET | Freq: Four times a day (QID) | ORAL | Status: DC | PRN

## 2018-05-31 NOTE — ED Provider Notes (Signed)
Patient has evidence of diverticulitis on CT.  She has received 2 doses of fentanyl as well as IV Zosyn.  There is no evidence of perforation or abscess on CT.  Her white count is markedly elevated at 24,000.  On reexam, she still very tender in her left abdomen and appears to be quite uncomfortable.   Given this, I feel that she would benefit from hospitalization.  I spoke with Dr. Toniann FailKakrakandy who will admit the patient.   Rolan BuccoBelfi, Nalina Yeatman, MD 05/31/18 [phone]

## 2018-05-31 NOTE — ED Triage Notes (Signed)
[place] with c/o near syncopal episode with left lower quad pain , pt first b/p was low with ems but fine here , pt alert and oriented on arrival

## 2018-05-31 NOTE — ED Provider Notes (Signed)
[facility] EMERGENCY DEPARTMENT Provider Note   CSN: [id number] Arrival date & time: 05/31/18  1318     History   Chief Complaint Chief Complaint  Patient presents with  . Near Syncope    HPI [name] is a 77 y.o. female.  HPI Patient presents with near syncopal episode.  States she was at church today when she developed severe left lower quadrant pain.  Then began to feel lightheaded.  States she almost passed out.  Became very sweaty.  Some mild nausea with the episode.  Continues have left lower abdominal pain.  States that she was able to walk couple miles this morning without difficulty.  No chest pain.  States she did not pass all the way out.  Upon arrival to the ER she did have some diarrhea.  Continued to have left-sided abdominal pain. Past Medical History:  Diagnosis Date  . Bradycardia 02/28/2017  . Chronic diastolic CHF (congestive heart failure) (HCC)   . Constipation   . Diastolic dysfunction   . Essential hypertension 05/06/2017  . Essential hypertension, benign 01/05/2014  . Gastric reflux   . GERD (gastroesophageal reflux disease)   . Hyperlipemia    dyslipidemia  . Hypertension   . Knee pain    bilateral  . Obesity   . Shoulder pain    right shoulder  . SOB (shortness of breath) on exertion   . Swelling    feet and legs  . Vitamin D deficiency 05/06/2017    Patient Active Problem List   Diagnosis Date Noted  . Class 1 obesity with serious comorbidity and body mass index (BMI) of 32.0 to 32.9 in adult 05/06/2017  . Vitamin D deficiency 05/06/2017  . Bradycardia 02/28/2017  . Hyperlipemia   . Diastolic dysfunction   . Chronic diastolic CHF (congestive heart failure) (HCC)   . Essential hypertension, benign 01/05/2014    Past Surgical History:  Procedure Laterality Date  . ABDOMINAL HYSTERECTOMY    . APPENDECTOMY  1977  . CARDIAC CATHETERIZATION  02/2009   no evidence of CAD/ Elevated LVEDP at the time ofcath c/w  diastolic dysfunction  . KNEE SURGERY  1986  . NECK SURGERY    . OOPHORECTOMY  1972  . SHOULDER ARTHROSCOPY       OB History    Gravida  3   Para  3   Term  3   Preterm      AB      Living  3     SAB      TAB      Ectopic      Multiple      Live Births               Home Medications    Prior to Admission medications   Medication Sig Start Date End Date Taking? Authorizing Provider  aspirin 81 MG tablet Take 81 mg by mouth daily.    [provider]  furosemide (LASIX) 20 MG tablet TAKE 1 TABLET BY MOUTH DAILY 04/08/18   Quintella Reichert, MD  lisinopril (PRINIVIL,ZESTRIL) 10 MG tablet Take 1 tablet (10 mg total) by mouth daily. 04/15/18   Quintella Reichert, MD  lovastatin (MEVACOR) 20 MG tablet Take 1 tablet (20 mg total) by mouth at bedtime. 11/17/17   Robbie Lis M, PA-C  lovastatin (MEVACOR) 40 MG tablet Take 0.5 tablets (20 mg total) by mouth at bedtime. 05/13/18   Quintella Reichert, MD  meloxicam [facility])  7.5 MG tablet Take 7.5 mg by mouth daily.     [provider]  Multiple Vitamins-Minerals (WOMENS MULTI VITAMIN & MINERAL) TABS Take 1 tablet by mouth daily.    [provider]  Omega-3 Fatty Acids (FISH OIL) 1200 MG CAPS Take 1,200 mg by mouth 2 (two) times daily. TOTAL OF 2,400 MG DAILY    [provider]  omeprazole (PRILOSEC) 20 MG capsule Take 20 mg by mouth daily.    [provider]  zolpidem (AMBIEN) 10 MG tablet Take 5 mg by mouth at bedtime as needed for sleep.     [provider]    Family History Family History  Problem Relation Age of Onset  . Heart failure Mother   . Diabetes Mother   . Hypertension Mother   . Hyperlipidemia Mother   . Stroke Mother   . Heart disease Mother   . CAD Father   . Heart attack Father   . Hypertension Father   . Hyperlipidemia Father   . Heart disease Father   . Sudden death Father   . CAD Sister   . CAD Brother   . Pancreatic cancer Brother   . CAD  Brother   . CAD Brother   . Heart failure Sister   . Other Brother        OPEN HEART  . Other Sister        OPEN HEART    Social History Social History   Tobacco Use  . Smoking status: Never Smoker  . Smokeless tobacco: Never Used  Substance Use Topics  . Alcohol use: No  . Drug use: No     Allergies   Morphine and related   Review of Systems Review of Systems  Constitutional: Positive for diaphoresis. Negative for appetite change.  HENT: Negative for congestion.   Respiratory: Negative for shortness of breath.   Cardiovascular: Negative for chest pain.  Gastrointestinal: Positive for abdominal pain and diarrhea.  Genitourinary: Negative for flank pain.  Musculoskeletal: Negative for back pain.  Neurological: Positive for light-headedness.  Hematological: Negative for adenopathy.  Psychiatric/Behavioral: Negative for confusion.     Physical Exam Updated Vital Signs BP (!) 121/57   Pulse 64   Temp 97.9 F (36.6 C) (Oral)   Resp 18   SpO2 99%   Physical Exam  Constitutional: She appears well-developed.  HENT:  Head: Atraumatic.  Eyes: EOM are normal.  Cardiovascular: Normal rate.  Pulmonary/Chest: Effort normal.  Abdominal: There is tenderness.  Moderate left lower suprapubic tenderness.  No hernia palpated.  Musculoskeletal: She exhibits no tenderness.  Neurological: She is alert.  Skin: Skin is warm. Capillary refill takes less than 2 seconds.     ED Treatments / Results  Labs (all labs ordered are listed, but only abnormal results are displayed) Labs Reviewed  COMPREHENSIVE METABOLIC PANEL - Abnormal; Notable for the following components:      Result Value   Chloride 97 (*)    Glucose, Bld 112 (*)    All other components within normal limits  CBC WITH DIFFERENTIAL/PLATELET - Abnormal; Notable for the following components:   WBC 24.3 (*)    Neutro Abs 19.1 (*)    Monocytes Absolute 1.3 (*)    Abs Immature Granulocytes 0.2 (*)    All other  components within normal limits  TROPONIN I  URINALYSIS, ROUTINE W REFLEX MICROSCOPIC    EKG EKG Interpretation  Date/Time:  Sunday May 31 2018 13:46:18 EDT Ventricular Rate:  61 PR  Interval:    QRS Duration: 101 QT Interval:  424 QTC Calculation: 428 R Axis:   19 Text Interpretation:  Sinus rhythm Borderline T abnormalities, anterior leads Confirmed by Benjiman Core [phone]) on 05/31/2018 1:56:10 PM   Radiology No results found.  Procedures Procedures (including critical care time)  Medications Ordered in ED Medications  sodium chloride 0.9 % bolus 500 mL (500 mLs Intravenous New Bag/Given 05/31/18 1442)  fentaNYL (SUBLIMAZE) 100 MCG/2ML injection (has no administration in time range)  fentaNYL (SUBLIMAZE) injection 50 mcg (50 mcg Intravenous Given 05/31/18 1442)  ondansetron (ZOFRAN) 4 MG/2ML injection (4 mg  Given 05/31/18 1443)     Initial Impression / Assessment and Plan / ED Course  I have reviewed the triage vital signs and the nursing notes.  Pertinent labs & imaging results that were available during my care of the patient were reviewed by me and considered in my medical decision making (see chart for details).     Patient with near syncope.  Left lower quadrant abdominal pain.  White count is elevated.  CT scan pending.  Care will be turned over to Dr. Fredderick Phenix.  Final Clinical Impressions(s) / ED Diagnoses   Final diagnoses:  Near syncope    ED Discharge Orders    None       Benjiman Core, MD 05/31/18 [phone]

## 2018-05-31 NOTE — H&P (Signed)
History and Physical    [name] VHQ:[id number] DOB: 09/23/1940 DOA: 05/31/2018  PCP: Lenell Antu, DO  Patient coming from: Home.  Chief Complaint: Abdominal pain and dizziness.  HPI: [name] is a 77 y.o. female with history of chronic diastolic CHF last EF measured in June 2016 was 60 to 65% with grade 1 diastolic dysfunction, hypertension, hyperlipidemia started experiencing dizziness and like patient was about to pass out while patient was in the church this morning.  Patient also was mildly diaphoretic.  Patient has been usually walking at least 2 miles every day and has done that this morning.  Has been controlling her diet strictly with the dietitian to lose weight over the last 1 year.  After her walk patient started noticing that she has been having some left lower quadrant pain but denies any nausea vomiting chest pain fever chills or any diarrhea.  Had recently been placed on prednisone for right hip pain by patient's orthopedic surgeon and the last dose was yesterday.  Patient family called EMS and patient was brought to the ER.  ED Course: In the ER on exam patient had left lower quadrant tenderness and had to be given pain relief medications.  Had one episode of diarrhea.  Has not taken any recent antibiotics.  CT abdomen pelvis shows sigmoid diverticular disease with no abscess or perforation.  Since patient still has pain patient admitted for further observation and was started on Zosyn.  EKG shows normal sinus rhythm with nonspecific T wave changes.  Review of Systems: As per HPI, rest all negative.   Past Medical History:  Diagnosis Date  . Bradycardia 02/28/2017  . Chronic diastolic CHF (congestive heart failure) (HCC)   . Constipation   . Diastolic dysfunction   . Essential hypertension 05/06/2017  . Essential hypertension, benign 01/05/2014  . Gastric reflux   . GERD (gastroesophageal reflux disease)   . Hyperlipemia    dyslipidemia  . Hypertension    . Knee pain    bilateral  . Obesity   . Shoulder pain    right shoulder  . SOB (shortness of breath) on exertion   . Swelling    feet and legs  . Vitamin D deficiency 05/06/2017    Past Surgical History:  Procedure Laterality Date  . ABDOMINAL HYSTERECTOMY    . APPENDECTOMY  1977  . CARDIAC CATHETERIZATION  02/2009   no evidence of CAD/ Elevated LVEDP at the time ofcath c/w diastolic dysfunction  . KNEE SURGERY  1986  . NECK SURGERY    . OOPHORECTOMY  1972  . SHOULDER ARTHROSCOPY       reports that she has never smoked. She has never used smokeless tobacco. She reports that she does not drink alcohol or use drugs.  Allergies  Allergen Reactions  . Morphine And Related Nausea And Vomiting    Family History  Problem Relation Age of Onset  . Heart failure Mother   . Diabetes Mother   . Hypertension Mother   . Hyperlipidemia Mother   . Stroke Mother   . Heart disease Mother   . CAD Father   . Heart attack Father   . Hypertension Father   . Hyperlipidemia Father   . Heart disease Father   . Sudden death Father   . CAD Sister   . CAD Brother   . Pancreatic cancer Brother   . CAD Brother   . CAD Brother   . Heart failure Sister   .  Other Brother        OPEN HEART  . Other Sister        OPEN HEART    Prior to Admission medications   Medication Sig Start Date End Date Taking? Authorizing Provider  acetaminophen (TYLENOL) 650 MG CR tablet Take 1,300 mg by mouth every 8 (eight) hours as needed for pain.   Yes [provider]  aspirin EC 81 MG tablet Take 81 mg by mouth at bedtime.   Yes [provider]  furosemide (LASIX) 20 MG tablet TAKE 1 TABLET BY MOUTH DAILY Patient taking differently: Take 20 mg by mouth daily.  04/08/18  Yes Turner, Cornelious Bryant, MD  lisinopril (PRINIVIL,ZESTRIL) 10 MG tablet Take 1 tablet (10 mg total) by mouth daily. Patient taking differently: Take 10 mg by mouth at bedtime.  04/15/18  Yes Turner, Cornelious Bryant, MD  lovastatin  (MEVACOR) 40 MG tablet Take 0.5 tablets (20 mg total) by mouth at bedtime. 05/13/18  Yes Turner, Cornelious Bryant, MD  Multiple Vitamin (MULTIVITAMIN WITH MINERALS) TABS tablet Take 1 tablet by mouth daily.   Yes [provider]  Omega-3 Fatty Acids (FISH OIL) 1000 MG CAPS Take 1,000 mg by mouth 2 (two) times daily.   Yes [provider]  omeprazole (PRILOSEC) 20 MG capsule Take 20 mg by mouth daily.   Yes [provider]  Polyvinyl Alcohol-Povidone (REFRESH OP) Place 1 drop into both eyes daily as needed (dry eyes).   Yes [provider]  zolpidem (AMBIEN) 5 MG tablet Take 5 mg by mouth at bedtime.    Yes [provider]  lovastatin (MEVACOR) 20 MG tablet Take 1 tablet (20 mg total) by mouth at bedtime. Patient not taking: Reported on 05/31/2018 11/17/17   Robbie Lis M, PA-C  meloxicam (MOBIC) 7.5 MG tablet Take 7.5 mg by mouth daily.     [provider]  predniSONE (DELTASONE) 10 MG tablet Take 20-60 mg by mouth See admin instructions. 12 day tapered course completed 05/30/18: take 6 tablets (60 mg) by mouth daily for 4 days, then take 4 tablets (40 mg) daily for 4 days, then take 2 tablets (20 mg) daily for 4 days, then stop 05/19/18   [provider]    Physical Exam: Vitals:   05/31/18 1800 05/31/18 1821 05/31/18 1930 05/31/18 1945  BP: 118/60 (!) 117/49 (!) 116/52 (!) 114/51  Pulse: 73 71 68 65  Resp: 18 (!) 21 12 20   Temp:      TempSrc:      SpO2: 98% 99% 100% 100%      Constitutional: Moderately built and nourished. Vitals:   05/31/18 1800 05/31/18 1821 05/31/18 1930 05/31/18 1945  BP: 118/60 (!) 117/49 (!) 116/52 (!) 114/51  Pulse: 73 71 68 65  Resp: 18 (!) 21 12 20   Temp:      TempSrc:      SpO2: 98% 99% 100% 100%   Eyes: Anicteric no pallor. ENMT: No discharge from the ears eyes nose or mouth. Neck: No mass felt.  No neck rigidity.  No JVD appreciated. Respiratory: No rhonchi or crepitations. Cardiovascular:  S1-S2 heard no murmurs appreciated. Abdomen: Soft left lower quadrant tenderness no guarding or rebound tenderness. Musculoskeletal: No edema. Skin: No rash. Neurologic: Alert awake oriented to time place and person.  Moves all extremities. Psychiatric: Appears normal.  Normal affect.   Labs on Admission: I have personally reviewed following labs and imaging studies  CBC: Recent Labs  Lab 05/31/18 1412  WBC 24.3*  NEUTROABS 19.1*  HGB 13.9  HCT 42.3  MCV 95.7  PLT 218   Basic Metabolic Panel: Recent Labs  Lab 05/31/18 1412  NA 137  K 3.5  CL 97*  CO2 25  GLUCOSE 112*  BUN 21  CREATININE 0.85  CALCIUM 9.9   GFR: CrCl cannot be calculated (Unknown ideal weight.). Liver Function Tests: Recent Labs  Lab 05/31/18 1412  AST 22  ALT 29  ALKPHOS 61  BILITOT 1.2  PROT 6.9  ALBUMIN 4.2   No results for input(s): LIPASE, AMYLASE in the last 168 hours. No results for input(s): AMMONIA in the last 168 hours. Coagulation Profile: No results for input(s): INR, PROTIME in the last 168 hours. Cardiac Enzymes: Recent Labs  Lab 05/31/18 1412  TROPONINI <0.03   BNP (last 3 results) No results for input(s): PROBNP in the last 8760 hours. HbA1C: No results for input(s): HGBA1C in the last 72 hours. CBG: No results for input(s): GLUCAP in the last 168 hours. Lipid Profile: No results for input(s): CHOL, HDL, LDLCALC, TRIG, CHOLHDL, LDLDIRECT in the last 72 hours. Thyroid Function Tests: No results for input(s): TSH, T4TOTAL, FREET4, T3FREE, THYROIDAB in the last 72 hours. Anemia Panel: No results for input(s): VITAMINB12, FOLATE, FERRITIN, TIBC, IRON, RETICCTPCT in the last 72 hours. Urine analysis:    Component Value Date/Time   COLORURINE YELLOW 04/22/2011 1414   APPEARANCEUR CLEAR 04/22/2011 1414   LABSPEC 1.006 04/22/2011 1414   PHURINE 7.0 04/22/2011 1414   GLUCOSEU NEGATIVE 04/22/2011 1414   HGBUR NEGATIVE 04/22/2011 1414   BILIRUBINUR NEGATIVE 04/22/2011  1414   KETONESUR NEGATIVE 04/22/2011 1414   PROTEINUR NEGATIVE 04/22/2011 1414   UROBILINOGEN 0.2 04/22/2011 1414   NITRITE NEGATIVE 04/22/2011 1414   LEUKOCYTESUR NEGATIVE 04/22/2011 1414   Sepsis Labs: @LABRCNTIP (procalcitonin:4,lacticidven:4) )No results found for this or any previous visit (from the past 240 hour(s)).   Radiological Exams on Admission: Ct Abdomen Pelvis W Contrast  Result Date: 05/31/2018 CLINICAL DATA:  77 year old female with acute LEFT abdominal and pelvic pain with nausea today. EXAM: CT ABDOMEN AND PELVIS WITH CONTRAST TECHNIQUE: Multidetector CT imaging of the abdomen and pelvis was performed using the standard protocol following bolus administration of intravenous contrast. CONTRAST:  OMNIPAQUE IOHEXOL 300 MG/ML  SOLN COMPARISON:  04/22/2011 CT FINDINGS: Lower chest: No acute abnormality. Hepatobiliary: Multiple hepatic cysts are again noted. The gallbladder is unremarkable. No biliary dilatation. Pancreas: Unremarkable Spleen: Unremarkable . Adrenals/Urinary Tract: The kidneys, adrenal glands and bladder are unremarkable. Stomach/Bowel: Focal wall thickening of the proximal sigmoid colon noted with adjacent inflammation likely represents acute diverticulitis. No bowel obstruction, pneumoperitoneum or abscess. No other bowel abnormalities are identified Vascular/Lymphatic: Aortic atherosclerosis. No enlarged abdominal or pelvic lymph nodes. Reproductive: Status post hysterectomy. No adnexal masses. Other: No ascites. Musculoskeletal: No acute or suspicious bony abnormalities noted. IMPRESSION: 1. Focal wall thickening of the proximal sigmoid colon with adjacent inflammation likely representing acute diverticulitis. No abscess, pneumoperitoneum or bowel obstruction. Clinical follow-up recommended. 2.  Aortic Atherosclerosis (ICD10-I70.0). Electronically Signed   By: Harmon Pier M.D.   On: 05/31/2018 16:31    EKG: Independently reviewed.  Normal sinus rhythm with  nonspecific T wave changes.  Assessment/Plan Principal Problem:   Acute diverticulitis Active Problems:   Essential hypertension, benign   Chronic diastolic CHF (congestive heart failure) (HCC)    1. Acute sigmoid diverticulitis -still has mild pain but patient is requesting to have some liquid diet which I have started.  If continues  to tolerate may advance diet.  Will keep patient on gentle hydration and continue Zosyn. 2. Near syncopal episode could be from pain.  Closely monitor for now and telemetry.  Check orthostatics in the morning.  I am trending troponin due to nonspecific changes. 3. Significant leukocytosis likely from sigmoid diverticulitis but patient also was recently on prednisone which could be contributing. 4. Chronic diastolic CHF with last EF measured in June 2016 was 60 to 65% with grade 1 diastolic dysfunction -hold Lasix for now due to near syncopal episode closely observe. 5. History of hypertension on lisinopril.  Holding of lisinopril for now and keeping patient on PRN IV hydralazine and check orthostatics in the morning. 6. History of hyperlipidemia patient states she is does not take the statins now.   DVT prophylaxis: SCDs. Code Status: Full code. Family Communication: Family at the bedside. Disposition Plan: Home. Consults called: None. Admission status: Observation.   Eduard ClosArshad N Quentyn Kolbeck MD [facility] Pager [phone].  If 7PM-7AM, please contact night-coverage [email or web address] Password [email or web address]  05/31/2018, 8:21 PM

## 2018-06-01 DIAGNOSIS — K5792 Diverticulitis of intestine, part unspecified, without perforation or abscess without bleeding: Secondary | ICD-10-CM

## 2018-06-01 LAB — HEPATIC FUNCTION PANEL
ALK PHOS: 59 U/L (ref 38–126)
ALT: 23 U/L (ref 0–44)
AST: 20 U/L (ref 15–41)
Albumin: 3.3 g/dL — ABNORMAL LOW (ref 3.5–5.0)
BILIRUBIN DIRECT: 0.2 mg/dL (ref 0.0–0.2)
BILIRUBIN TOTAL: 1.1 mg/dL (ref 0.3–1.2)
Indirect Bilirubin: 0.9 mg/dL (ref 0.3–0.9)
Total Protein: 5.9 g/dL — ABNORMAL LOW (ref 6.5–8.1)

## 2018-06-01 LAB — BASIC METABOLIC PANEL
ANION GAP: 10 (ref 5–15)
BUN: 14 mg/dL (ref 8–23)
CO2: 25 mmol/L (ref 22–32)
Calcium: 8.6 mg/dL — ABNORMAL LOW (ref 8.9–10.3)
Chloride: 104 mmol/L (ref 98–111)
Creatinine, Ser: 0.81 mg/dL (ref 0.44–1.00)
GFR calc Af Amer: >60 mL/min (ref >60)
GFR calc non Af Amer: >60 mL/min (ref >60)
GLUCOSE: 124 mg/dL — AB (ref 70–99)
Potassium: 4.2 mmol/L (ref 3.5–5.1)
Sodium: 139 mmol/L (ref 135–145)

## 2018-06-01 LAB — CBC WITH DIFFERENTIAL/PLATELET
ABS IMMATURE GRANULOCYTES: 0.1 10*3/uL (ref 0.0–0.1)
BASOS PCT: 0 %
Basophils Absolute: 0 10*3/uL (ref 0.0–0.1)
EOS PCT: 0 %
Eosinophils Absolute: 0 10*3/uL (ref 0.0–0.7)
HCT: 37.9 % (ref 36.0–46.0)
Hemoglobin: 12.2 g/dL (ref 12.0–15.0)
Immature Granulocytes: 0 %
Lymphocytes Relative: 17 %
Lymphs Abs: 2.3 10*3/uL (ref 0.7–4.0)
MCH: 30.9 pg (ref 26.0–34.0)
MCHC: 32.2 g/dL (ref 30.0–36.0)
MCV: 95.9 fL (ref 78.0–100.0)
MONO ABS: 1 10*3/uL (ref 0.1–1.0)
MONOS PCT: 8 %
Neutro Abs: 9.8 10*3/uL — ABNORMAL HIGH (ref 1.7–7.7)
Neutrophils Relative %: 75 %
PLATELETS: 182 10*3/uL (ref 150–400)
RBC: 3.95 MIL/uL (ref 3.87–5.11)
RDW: 13.4 % (ref 11.5–15.5)
WBC: 13.1 10*3/uL — ABNORMAL HIGH (ref 4.0–10.5)

## 2018-06-01 LAB — TROPONIN I
Troponin I: <0.03 ng/mL (ref <0.03)
Troponin I: <0.03 ng/mL (ref <0.03)

## 2018-06-01 MED ORDER — PIPERACILLIN-TAZOBACTAM 3.375 G IVPB
3.3750 g | Freq: Three times a day (TID) | INTRAVENOUS | Status: DC
  Administered 2018-06-01: 3.375 g via INTRAVENOUS
  Filled 2018-06-01 (×2): qty 50

## 2018-06-01 MED ORDER — METRONIDAZOLE 500 MG PO TABS: 500.0000 mg | ORAL_TABLET | Freq: Three times a day (TID) | ORAL | 0 refills | Status: AC

## 2018-06-01 NOTE — Discharge Summary (Signed)
Physician Discharge Summary  Jill JarvisLinda S Rosario EAV:[id number] DOB: 08-09-1941 DOA: 05/31/2018  PCP: Lenell AntuLe, Thao P, DO  Admit date: 05/31/2018 Discharge date: 06/01/2018  Time spent: 65 minutes  Recommendations for Outpatient Follow-up:  1. Discharge to home in stable condition. Follow up with PCP 1 week for evaluation of abdominal pain. If no improvement may need to consider adding cipro to antibiotic regimen 2. Full liquid diet to be advanced by patient as tolerated 3. Complete all medications as prescribed   Discharge Diagnoses:  Principal Problem:   Acute diverticulitis Active Problems:   Essential hypertension, benign   Chronic diastolic CHF (congestive heart failure) (HCC)   Discharge Condition: stable  Diet recommendation: heart healthy full liquid to be advanced by patient as tolerated  [facility]   05/31/18 2114 06/01/18 0411  Weight: 73.9 kg 75.4 kg    History of present illness:  [name] is a 77 y.o. female with history of chronic diastolic CHF last EF measured in June 2016 was 60 to 65% with grade 1 diastolic dysfunction, hypertension, hyperlipidemia started experiencing dizziness and near syncope in the [place].  Patient also was mildly diaphoretic.  Patient had been usually walking at least 2 miles every day and has done that this morning.  Has been controlling her diet strictly with the dietitian to lose weight over the last 1 year.  After her walk patient started noticing that she has been having some left lower quadrant pain but denied any nausea vomiting chest pain fever chills or any diarrhea.  Had recently been placed on prednisone for right hip pain by patient's orthopedic surgeon and the last dose was yesterday. Patient family called EMS and patient was brought to the ER.  Hospital Course:  1. Acute sigmoid diverticulitis - pain resolved at discharge. Tolerating full liquid diet at discharge. Received 2 doses Zosyn in hospital. Will discharge with 10  days flagyl. If no improvement follow up with PCP for consideration of adding cipro to regimen.  2. Near syncopal episode. Likely related to pain. No events on tele. BP stable and not orthostatic. Troponin negative. No metabolic derangement 3. Significant leukocytosis likely related to recent prednisone use in setting of  sigmoid diverticulitis. Trending down at discharge. She remained afebrile and non-toxic appearing.  4. Chronic diastolic CHF with last EF measured in June 2016 was 60 to 65% with grade 1 diastolic dysfunction -Held Lasix. Will resume at discharge 5. History of hypertension on lisinopril. Resume at discharge 6. History of hyperlipidemia patient states she is does not take the statins now  Procedures:    Consultations:  none  Discharge Exam: Vitals:   06/01/18 0917 06/01/18 1302  BP: (!) 137/45 (!) 130/59  Pulse: (!) 55 64  Resp: 16 18  Temp: 98.2 F (36.8 C) (!) 97.5 F (36.4 C)  SpO2: 97% 100%    General: well nourished sitting up in bed eating breakfast Cardiovascular: rrr no MGR no LE edema Respiratory: normal effort BS clear bilaterally no wheeze Abdomin: obese soft +BS mild diffuse tenderness. No guarding or rebounding  Discharge Instructions   Discharge Instructions    Call MD for:  persistant nausea and vomiting   Complete by:  As directed    Call MD for:  severe uncontrolled pain   Complete by:  As directed    Call MD for:  temperature >100.4   Complete by:  As directed    Diet - low sodium heart healthy   Complete by:  As directed  Full liquids initinally Advance diet as tolerated   Discharge instructions   Complete by:  As directed    Continue full liquid diet.  Advance diet SLOWLY Recommend small frequent meas Follow up with PCP 1 week for evaluation of abdominal pain If no improvement may need to consider adding Cipro to antibiotic regimen   Increase activity slowly   Complete by:  As directed      Allergies as of 06/01/2018       Reactions   Morphine And Related Nausea And Vomiting      Medication List    STOP taking these medications   predniSONE 10 MG tablet Commonly known as:  DELTASONE     TAKE these medications   acetaminophen 650 MG CR tablet Commonly known as:  TYLENOL Take 1,300 mg by mouth every 8 (eight) hours as needed for pain.   aspirin EC 81 MG tablet Take 81 mg by mouth at bedtime.   Fish Oil 1000 MG Caps Take 1,000 mg by mouth 2 (two) times daily.   furosemide 20 MG tablet Commonly known as:  LASIX TAKE 1 TABLET BY MOUTH DAILY   lisinopril 10 MG tablet Commonly known as:  PRINIVIL,ZESTRIL Take 1 tablet (10 mg total) by mouth daily. What changed:  when to take this   lovastatin 40 MG tablet Commonly known as:  MEVACOR Take 0.5 tablets (20 mg total) by mouth at bedtime. What changed:  Another medication with the same name was removed. Continue taking this medication, and follow the directions you see here.   meloxicam 7.5 MG tablet Commonly known as:  MOBIC Take 7.5 mg by mouth daily.   metroNIDAZOLE 500 MG tablet Commonly known as:  FLAGYL Take 1 tablet (500 mg total) by mouth 3 (three) times daily for 10 days.   multivitamin with minerals Tabs tablet Take 1 tablet by mouth daily.   omeprazole 20 MG capsule Commonly known as:  PRILOSEC Take 20 mg by mouth daily.   REFRESH OP Place 1 drop into both eyes daily as needed (dry eyes).   zolpidem 5 MG tablet Commonly known as:  AMBIEN Take 5 mg by mouth at bedtime.      Allergies  Allergen Reactions  . Morphine And Related Nausea And Vomiting      The results of significant diagnostics from this hospitalization (including imaging, microbiology, ancillary and laboratory) are listed below for reference.    Significant Diagnostic Studies: Ct Abdomen Pelvis W Contrast  Result Date: 05/31/2018 CLINICAL DATA:  77 year old female with acute LEFT abdominal and pelvic pain with nausea today. EXAM: CT ABDOMEN AND PELVIS  WITH CONTRAST TECHNIQUE: Multidetector CT imaging of the abdomen and pelvis was performed using the standard protocol following bolus administration of intravenous contrast. CONTRAST:  OMNIPAQUE IOHEXOL 300 MG/ML  SOLN COMPARISON:  04/22/2011 CT FINDINGS: Lower chest: No acute abnormality. Hepatobiliary: Multiple hepatic cysts are again noted. The gallbladder is unremarkable. No biliary dilatation. Pancreas: Unremarkable Spleen: Unremarkable . Adrenals/Urinary Tract: The kidneys, adrenal glands and bladder are unremarkable. Stomach/Bowel: Focal wall thickening of the proximal sigmoid colon noted with adjacent inflammation likely represents acute diverticulitis. No bowel obstruction, pneumoperitoneum or abscess. No other bowel abnormalities are identified Vascular/Lymphatic: Aortic atherosclerosis. No enlarged abdominal or pelvic lymph nodes. Reproductive: Status post hysterectomy. No adnexal masses. Other: No ascites. Musculoskeletal: No acute or suspicious bony abnormalities noted. IMPRESSION: 1. Focal wall thickening of the proximal sigmoid colon with adjacent inflammation likely representing acute diverticulitis. No abscess, pneumoperitoneum or bowel obstruction.  Clinical follow-up recommended. 2.  Aortic Atherosclerosis (ICD10-I70.0). Electronically Signed   By: Harmon Pier M.D.   On: 05/31/2018 16:31    Microbiology: No results found for this or any previous visit (from the past 240 hour(s)).   Labs: Basic Metabolic Panel: Recent Labs  Lab 05/31/18 1412 06/01/18 0348  NA 137 139  K 3.5 4.2  CL 97* 104  CO2 25 25  GLUCOSE 112* 124*  BUN 21 14  CREATININE 0.85 0.81  CALCIUM 9.9 8.6*   Liver Function Tests: Recent Labs  Lab 05/31/18 1412 06/01/18 0348  AST 22 20  ALT 29 23  ALKPHOS 61 59  BILITOT 1.2 1.1  PROT 6.9 5.9*  ALBUMIN 4.2 3.3*   No results for input(s): LIPASE, AMYLASE in the last 168 hours. No results for input(s): AMMONIA in the last 168 hours. CBC: Recent Labs   Lab 05/31/18 1412 06/01/18 0348  WBC 24.3* 13.1*  NEUTROABS 19.1* 9.8*  HGB 13.9 12.2  HCT 42.3 37.9  MCV 95.7 95.9  PLT 218 182   Cardiac Enzymes: Recent Labs  Lab 05/31/18 1412 06/01/18 0615 06/01/18 1117  TROPONINI <0.03 <0.03 <0.03   BNP: BNP (last 3 results) No results for input(s): BNP in the last 8760 hours.  ProBNP (last 3 results) No results for input(s): PROBNP in the last 8760 hours.  CBG: No results for input(s): GLUCAP in the last 168 hours.     Signed:  Gwenyth Bender MD.  [facility] 06/01/2018, 1:12 PM

## 2018-06-01 NOTE — Progress Notes (Signed)
Pharmacy Antibiotic Note  [name] is a 77 y.o. female admitted on 05/31/2018 with diverticulitis.  Pharmacy has been consulted for Zosyn dosing.  Plan: Zosyn 3.375g IV q8h (4-hour infusion).  Height: 5' 2.5" (158.8 cm) Weight: 166 lb 3.6 oz (75.4 kg) IBW/kg (Calculated) : 51.25  Temp (24hrs), Avg:98.5 F (36.9 C), Min:97.9 F (36.6 C), Max:99.1 F (37.3 C)  Recent Labs  Lab 05/31/18 1412 06/01/18 0348  WBC 24.3* 13.1*  CREATININE 0.85 0.81    Estimated Creatinine Clearance: 58.6 mL/min (by C-G formula based on SCr of 0.81 mg/dL).    Allergies  Allergen Reactions  . Morphine And Related Nausea And Vomiting     Thank you for allowing pharmacy to be a part of this patient's care.  [name], PharmD, BCPS  06/01/2018 5:57 AM

## 2018-06-12 DIAGNOSIS — Z23 Encounter for immunization: Secondary | ICD-10-CM | POA: Diagnosis not present

## 2018-06-12 DIAGNOSIS — Z09 Encounter for follow-up examination after completed treatment for conditions other than malignant neoplasm: Secondary | ICD-10-CM | POA: Diagnosis not present

## 2018-06-12 DIAGNOSIS — R197 Diarrhea, unspecified: Secondary | ICD-10-CM | POA: Diagnosis not present

## 2018-06-12 DIAGNOSIS — K5792 Diverticulitis of intestine, part unspecified, without perforation or abscess without bleeding: Secondary | ICD-10-CM | POA: Diagnosis not present

## 2018-06-15 DIAGNOSIS — Z09 Encounter for follow-up examination after completed treatment for conditions other than malignant neoplasm: Secondary | ICD-10-CM | POA: Diagnosis not present

## 2018-08-12 ENCOUNTER — Other Ambulatory Visit (HOSPITAL_BASED_OUTPATIENT_CLINIC_OR_DEPARTMENT_OTHER): Payer: Self-pay | Admitting: Family Medicine

## 2018-08-12 DIAGNOSIS — Z1231 Encounter for screening mammogram for malignant neoplasm of breast: Secondary | ICD-10-CM

## 2018-08-14 ENCOUNTER — Ambulatory Visit (HOSPITAL_BASED_OUTPATIENT_CLINIC_OR_DEPARTMENT_OTHER)
Admission: RE | Admit: 2018-08-14 | Discharge: 2018-08-14 | Disposition: A | Discharge status: Home / Self Care | Payer: Medicare Other | Source: Ambulatory Visit | Attending: Family Medicine | Admitting: Family Medicine

## 2018-08-14 DIAGNOSIS — Z1231 Encounter for screening mammogram for malignant neoplasm of breast: Secondary | ICD-10-CM | POA: Insufficient documentation

## 2018-08-25 DIAGNOSIS — M7061 Trochanteric bursitis, right hip: Secondary | ICD-10-CM | POA: Diagnosis not present

## 2018-08-25 DIAGNOSIS — M1611 Unilateral primary osteoarthritis, right hip: Secondary | ICD-10-CM | POA: Diagnosis not present

## 2018-10-14 DIAGNOSIS — K219 Gastro-esophageal reflux disease without esophagitis: Secondary | ICD-10-CM | POA: Diagnosis not present

## 2018-10-14 DIAGNOSIS — J019 Acute sinusitis, unspecified: Secondary | ICD-10-CM | POA: Diagnosis not present

## 2018-10-21 ENCOUNTER — Other Ambulatory Visit: Payer: Self-pay | Admitting: Cardiology

## 2018-11-12 DIAGNOSIS — L570 Actinic keratosis: Secondary | ICD-10-CM | POA: Diagnosis not present

## 2018-11-12 DIAGNOSIS — L821 Other seborrheic keratosis: Secondary | ICD-10-CM | POA: Diagnosis not present

## 2018-11-12 DIAGNOSIS — L814 Other melanin hyperpigmentation: Secondary | ICD-10-CM | POA: Diagnosis not present

## 2019-02-18 DIAGNOSIS — M1711 Unilateral primary osteoarthritis, right knee: Secondary | ICD-10-CM | POA: Diagnosis not present

## 2019-02-24 DIAGNOSIS — H35373 Puckering of macula, bilateral: Secondary | ICD-10-CM | POA: Diagnosis not present

## 2019-02-24 DIAGNOSIS — H04123 Dry eye syndrome of bilateral lacrimal glands: Secondary | ICD-10-CM | POA: Diagnosis not present

## 2019-02-24 DIAGNOSIS — H35033 Hypertensive retinopathy, bilateral: Secondary | ICD-10-CM | POA: Diagnosis not present

## 2019-02-24 DIAGNOSIS — H40013 Open angle with borderline findings, low risk, bilateral: Secondary | ICD-10-CM | POA: Diagnosis not present

## 2019-04-19 ENCOUNTER — Other Ambulatory Visit: Payer: Self-pay | Admitting: Cardiology

## 2019-06-14 ENCOUNTER — Other Ambulatory Visit: Payer: Self-pay | Admitting: Cardiology

## 2019-06-14 MED ORDER — LISINOPRIL 10 MG PO TABS: 10.0000 mg | ORAL_TABLET | Freq: Every day | ORAL | 0 refills | Status: AC

## 2019-06-14 MED ORDER — LOVASTATIN 40 MG PO TABS: 20.0000 mg | ORAL_TABLET | Freq: Every day | ORAL | 0 refills | Status: AC

## 2019-06-16 ENCOUNTER — Telehealth: Payer: Self-pay | Admitting: Cardiology

## 2019-06-16 NOTE — Telephone Encounter (Signed)
New message:     Patient calling and would like for someone to call concering a appt with the doctor. Patient has a appt 10/20 and would  Like to know if all her test can be sent primary. Please call patien.

## 2019-06-16 NOTE — Telephone Encounter (Signed)
lpmtcb 10/7 

## 2019-06-16 NOTE — Telephone Encounter (Signed)
The patient is having a physical at her PCP on 10/14 and will fax over labs to Dr Radford Pax when complete.

## 2019-06-17 ENCOUNTER — Telehealth: Payer: Self-pay | Admitting: Cardiology

## 2019-06-23 DIAGNOSIS — I10 Essential (primary) hypertension: Secondary | ICD-10-CM | POA: Diagnosis not present

## 2019-06-23 DIAGNOSIS — Z0000 Encounter for general adult medical examination without abnormal findings: Secondary | ICD-10-CM | POA: Diagnosis not present

## 2019-06-23 DIAGNOSIS — Z23 Encounter for immunization: Secondary | ICD-10-CM | POA: Diagnosis not present

## 2019-06-23 DIAGNOSIS — Z8679 Personal history of other diseases of the circulatory system: Secondary | ICD-10-CM | POA: Diagnosis not present

## 2019-06-28 DIAGNOSIS — M7581 Other shoulder lesions, right shoulder: Secondary | ICD-10-CM | POA: Diagnosis not present

## 2019-06-28 DIAGNOSIS — M542 Cervicalgia: Secondary | ICD-10-CM | POA: Diagnosis not present

## 2019-07-05 DIAGNOSIS — M5412 Radiculopathy, cervical region: Secondary | ICD-10-CM | POA: Diagnosis not present

## 2019-07-05 DIAGNOSIS — G5601 Carpal tunnel syndrome, right upper limb: Secondary | ICD-10-CM | POA: Diagnosis not present

## 2019-07-05 DIAGNOSIS — M5021 Other cervical disc displacement,  high cervical region: Secondary | ICD-10-CM | POA: Diagnosis not present

## 2019-07-05 DIAGNOSIS — M542 Cervicalgia: Secondary | ICD-10-CM | POA: Diagnosis not present

## 2019-07-13 DIAGNOSIS — M542 Cervicalgia: Secondary | ICD-10-CM | POA: Diagnosis not present

## 2019-07-13 DIAGNOSIS — M5412 Radiculopathy, cervical region: Secondary | ICD-10-CM | POA: Diagnosis not present

## 2019-07-19 ENCOUNTER — Other Ambulatory Visit: Payer: Self-pay

## 2019-07-19 ENCOUNTER — Ambulatory Visit: Payer: Medicare Other | Admitting: Cardiology

## 2019-07-19 ENCOUNTER — Telehealth: Payer: Self-pay | Admitting: Neurology

## 2019-07-19 ENCOUNTER — Encounter: Payer: Self-pay | Admitting: Cardiology

## 2019-07-19 VITALS — BP 144/68 | HR 65 | Ht 62.5 in | Wt 198.8 lb

## 2019-07-19 DIAGNOSIS — M4802 Spinal stenosis, cervical region: Secondary | ICD-10-CM | POA: Diagnosis not present

## 2019-07-19 DIAGNOSIS — M489 Spondylopathy, unspecified: Secondary | ICD-10-CM

## 2019-07-19 DIAGNOSIS — I5032 Chronic diastolic (congestive) heart failure: Secondary | ICD-10-CM

## 2019-07-19 DIAGNOSIS — E7800 Pure hypercholesterolemia, unspecified: Secondary | ICD-10-CM | POA: Diagnosis not present

## 2019-07-19 DIAGNOSIS — R001 Bradycardia, unspecified: Secondary | ICD-10-CM | POA: Diagnosis not present

## 2019-07-19 DIAGNOSIS — I10 Essential (primary) hypertension: Secondary | ICD-10-CM

## 2019-07-19 DIAGNOSIS — M5412 Radiculopathy, cervical region: Secondary | ICD-10-CM | POA: Diagnosis not present

## 2019-07-19 DIAGNOSIS — M5124 Other intervertebral disc displacement, thoracic region: Secondary | ICD-10-CM | POA: Diagnosis not present

## 2019-07-19 DIAGNOSIS — M5414 Radiculopathy, thoracic region: Secondary | ICD-10-CM | POA: Diagnosis not present

## 2019-07-19 NOTE — Patient Instructions (Signed)
Medication Instructions:   Your physician recommends that you continue on your current medications as directed. Please refer to the Current Medication list given to you today.  *If you need a refill on your cardiac medications before your next appointment, please call your pharmacy*   Lab Work:  TODAY-CMET AND LIPIDS  If you have labs (blood work) drawn today and your tests are completely normal, you will receive your results only by: Marland Kitchen MyChart Message (if you have MyChart) OR . A paper copy in the mail If you have any lab test that is abnormal or we need to change your treatment, we will call you to review the results.   Follow-Up: At [facility], you and your health needs are our priority.  As part of our continuing mission to provide you with exceptional heart care, we have created designated Provider Care Teams.  These Care Teams include your primary Cardiologist (physician) and Advanced Practice Providers (APPs -  Physician Assistants and Nurse Practitioners) who all work together to provide you with the care you need, when you need it.  Your next appointment:   12 months  The format for your next appointment:   Either In Person or Virtual  Provider:   Fransico Him, MD

## 2019-07-19 NOTE — Telephone Encounter (Signed)
This is an urgent referral, [name] called me - we may need to squeeze her in. Luckily she is upper extremity probably CTS can we look this week or early next week? He has to operate on her soon. thanks

## 2019-07-19 NOTE — Progress Notes (Signed)
Cardiology Office Note:    Date:  07/19/2019   ID:  [name], DOB Jan 18, 1941, MRN [id number]  PCP:  Glenford Bayley, DO  Cardiologist:  No primary care provider on file.    Referring MD: Glenford Bayley, DO   No chief complaint on file.   History of Present Illness:    [name] is a 78 y.o. female with a hx of chronic diastolic CHF, normal LVF, normal coronary arteries by cath 2010, HTN and dyslipidemia.  She is here today for followup and is doing well.  She denies any chest pain or pressure, SOB, DOE, PND, orthopnea, LE edema, dizziness, palpitations or syncope. She is compliant with her meds and is tolerating meds with no SE.  Her main complaint is that she is having severe pain in her cervical spine again.  She has had several spine surgeries in the past and may be facing another one.    Past Medical History:  Diagnosis Date  . Bradycardia 02/28/2017  . Chronic diastolic CHF (congestive heart failure) ([place])   . Constipation   . Diastolic dysfunction   . Essential hypertension 05/06/2017  . Essential hypertension, benign 01/05/2014  . Gastric reflux   . GERD (gastroesophageal reflux disease)   . Hyperlipemia    dyslipidemia  . Hypertension   . Knee pain    bilateral  . Obesity   . Shoulder pain    right shoulder  . SOB (shortness of breath) on exertion   . Swelling    feet and legs  . Vitamin D deficiency 05/06/2017    Past Surgical History:  Procedure Laterality Date  . ABDOMINAL HYSTERECTOMY    . APPENDECTOMY  1977  . CARDIAC CATHETERIZATION  02/2009   no evidence of CAD/ Elevated LVEDP at the time ofcath c/w diastolic dysfunction  . KNEE SURGERY  1986  . NECK SURGERY    . OOPHORECTOMY  1972  . SHOULDER ARTHROSCOPY      Current Medications: Current Meds  Medication Sig  . acetaminophen (TYLENOL) 650 MG CR tablet Take 1,300 mg by mouth every 8 (eight) hours as needed for pain.  Marland Kitchen aspirin EC 81 MG tablet Take 81 mg by mouth at bedtime.  . furosemide  (LASIX) 20 MG tablet TAKE 1 TABLET BY MOUTH DAILY  . lisinopril (ZESTRIL) 10 MG tablet Take 1 tablet (10 mg total) by mouth daily. Please make overdue appt with Dr. Radford Pax before anymore refills. 2nd attempt  . lovastatin (MEVACOR) 40 MG tablet Take 0.5 tablets (20 mg total) by mouth at bedtime. Please make overdue appt with Dr. Radford Pax before anymore refills. 2nd attempt  . meloxicam (MOBIC) 7.5 MG tablet Take 7.5 mg by mouth daily.   . Multiple Vitamin (MULTIVITAMIN WITH MINERALS) TABS tablet Take 1 tablet by mouth daily.  . Omega-3 Fatty Acids (FISH OIL) 1000 MG CAPS Take 1,000 mg by mouth 2 (two) times daily.  Marland Kitchen omeprazole (PRILOSEC) 20 MG capsule Take 20 mg by mouth daily.  . Polyvinyl Alcohol-Povidone (REFRESH OP) Place 1 drop into both eyes daily as needed (dry eyes).  . traMADol (ULTRAM) 50 MG tablet Take 50 mg by mouth every 6 (six) hours as needed.  . zolpidem (AMBIEN) 5 MG tablet Take 5 mg by mouth at bedtime.      Allergies:   Morphine and related   Social History   Socioeconomic History  . Marital status: Married    Spouse name: Pilar Plate  . Number of children: Not on  file  . Years of education: Not on file  . Highest education level: Not on file  Occupational History  . Occupation: retired - housekeeper  Social Needs  . Financial resource strain: Not on file  . Food insecurity    Worry: Not on file    Inability: Not on file  . Transportation needs    Medical: Not on file    Non-medical: Not on file  Tobacco Use  . Smoking status: Never Smoker  . Smokeless tobacco: Never Used  Substance and Sexual Activity  . Alcohol use: No  . Drug use: No  . Sexual activity: Never  Lifestyle  . Physical activity    Days per week: Not on file    Minutes per session: Not on file  . Stress: Not on file  Relationships  . Social Herbalist on phone: Not on file    Gets together: Not on file    Attends religious service: Not on file    Active member of club or  organization: Not on file    Attends meetings of clubs or organizations: Not on file    Relationship status: Not on file  Other Topics Concern  . Not on file  Social History Narrative  . Not on file     Family History: The patient's family history includes CAD in her brother, brother, brother, father, and sister; Diabetes in her mother; Heart attack in her father; Heart disease in her father and mother; Heart failure in her mother and sister; Hyperlipidemia in her father and mother; Hypertension in her father and mother; Other in her brother and sister; Pancreatic cancer in her brother; Stroke in her mother; Sudden death in her father.  ROS:   Please see the history of present illness.    ROS  All other systems reviewed and negative.   EKGs/Labs/Other Studies Reviewed:    The following studies were reviewed today: none  EKG:  EKG is not ordered today.    Recent Labs: No results found for requested labs within last 8760 hours.   Recent Lipid Panel    Component Value Date/Time   CHOL 147 02/23/2018 0941   TRIG 80 02/23/2018 0941   HDL 45 02/23/2018 0941   CHOLHDL 3.3 02/23/2018 0941   CHOLHDL 4.5 03/18/2016 1000   VLDL 25 03/18/2016 1000   LDLCALC 86 02/23/2018 0941    Physical Exam:    VS:  BP (!) 144/68   Pulse 65   Ht 5' 2.5" (1.588 m)   Wt 198 lb 12.8 oz (90.2 kg)   SpO2 98%   BMI 35.78 kg/m     Wt Readings from Last 3 Encounters:  07/19/19 198 lb 12.8 oz (90.2 kg)  06/01/18 166 lb 3.6 oz (75.4 kg)  02/23/18 160 lb (72.6 kg)     GEN:  Well nourished, well developed in no acute distress HEENT: Normal NECK: No JVD; No carotid bruits LYMPHATICS: No lymphadenopathy CARDIAC: RRR, no murmurs, rubs, gallops RESPIRATORY:  Clear to auscultation without rales, wheezing or rhonchi  ABDOMEN: Soft, non-tender, non-distended MUSCULOSKELETAL:  No edema; No deformity  SKIN: Warm and dry NEUROLOGIC:  Alert and oriented x 3 PSYCHIATRIC:  Normal affect   ASSESSMENT:     1. Chronic diastolic CHF (congestive heart failure) ([place])   2. Essential hypertension, benign   3. Bradycardia   4. Pure hypercholesterolemia   5. Cervical spine disease    PLAN:    In order of problems listed above:  1.  Chronic diastolic CHF -she denies any SOB or LE edema -her weight is stable -continue lasix 32m daily -check BMET today  2.  HTN -BP is borderline controlled -she is in a lot of pain with her neck so likely causing some elevated in BP -continue Lisinopril 12mdaily -check BMET  3.  Bradycardia -bradycardia resolved  4.  HLD -LDL goal is < 100 -continue Lovatatin 2050maily -she is fasting today so will check FLP and ALT  5.  Cervical spine disease -she is having a lot of neck pain again and may need surgery -she is low risk from cardiac standpoint for major cardiac event should she need spine surgery   Medication Adjustments/Labs and Tests Ordered: Current medicines are reviewed at length with the patient today.  Concerns regarding medicines are outlined above.  Orders Placed This Encounter  Procedures  . Comp Met (CMET)  . Lipid Profile   No orders of the defined types were placed in this encounter.   Signed, TraFransico HimD  07/19/2019 4:04 PM    [facility]

## 2019-07-20 LAB — COMPREHENSIVE METABOLIC PANEL
ALT: 28 IU/L (ref 0–32)
AST: 22 IU/L (ref 0–40)
Albumin/Globulin Ratio: 2.2 (ref 1.2–2.2)
Albumin: 4.6 g/dL (ref 3.7–4.7)
Alkaline Phosphatase: 101 IU/L (ref 39–117)
BUN/Creatinine Ratio: 32 — ABNORMAL HIGH (ref 12–28)
BUN: 22 mg/dL (ref 8–27)
Bilirubin Total: 0.7 mg/dL (ref 0.0–1.2)
CO2: 25 mmol/L (ref 20–29)
Calcium: 9.5 mg/dL (ref 8.7–10.3)
Chloride: 100 mmol/L (ref 96–106)
Creatinine, Ser: 0.69 mg/dL (ref 0.57–1.00)
GFR calc Af Amer: 98 mL/min/{1.73_m2} (ref >59)
GFR calc non Af Amer: 85 mL/min/{1.73_m2} (ref >59)
Globulin, Total: 2.1 g/dL (ref 1.5–4.5)
Glucose: 89 mg/dL (ref 65–99)
Potassium: 4 mmol/L (ref 3.5–5.2)
Sodium: 138 mmol/L (ref 134–144)
Total Protein: 6.7 g/dL (ref 6.0–8.5)

## 2019-07-20 LAB — LIPID PANEL
Chol/HDL Ratio: 3.6 ratio (ref 0.0–4.4)
Cholesterol, Total: 171 mg/dL (ref 100–199)
HDL: 47 mg/dL (ref >39)
LDL Chol Calc (NIH): 101 mg/dL — ABNORMAL HIGH (ref 0–99)
Triglycerides: 127 mg/dL (ref 0–149)
VLDL Cholesterol Cal: 23 mg/dL (ref 5–40)

## 2019-07-20 NOTE — Telephone Encounter (Signed)
It would be with Elmyra Ricks at 9:15 followed by Dr. Jaynee Eagles at 10:00.

## 2019-07-20 NOTE — Telephone Encounter (Signed)
Nikki, I had a cancellation Thursday can you call this patient and get her on the schedule?

## 2019-07-22 ENCOUNTER — Ambulatory Visit: Payer: Medicare Other | Admitting: Neurology

## 2019-07-22 ENCOUNTER — Other Ambulatory Visit: Payer: Self-pay

## 2019-07-22 ENCOUNTER — Ambulatory Visit (INDEPENDENT_AMBULATORY_CARE_PROVIDER_SITE_OTHER): Payer: Medicare Other | Admitting: Neurology

## 2019-07-22 DIAGNOSIS — M79601 Pain in right arm: Secondary | ICD-10-CM

## 2019-07-22 DIAGNOSIS — Z0289 Encounter for other administrative examinations: Secondary | ICD-10-CM

## 2019-07-22 NOTE — Progress Notes (Signed)
Full Name: [name] Gender: Female MRN #: [id number] Date of Birth: [birth year]    Visit Date: 07/22/2019 09:10 Age: 78 Years 61 Months Old Examining Physician: Naomie Dean, MD  Referring Physician: Maeola Harman, MD   History; Patient here for evaluation of right arm pain and possible CTS. She reports she gets pains in the wrists occasionally, she points to the ulnar styloid on each wrist where the pain is located, denies any other pain or numbness/tingling in the fingers, and this is improved with splints, very localized, not an issue anymore.  The pain in the upper right extremity started in the shoulder. She was working (manual labor, lifting, cleaning) and she felt on the right (she points to the trap area on the right), started with Biofreeze topical treatments but it worsened,  it radiates from that spot down to the back of the arm and to the elbow.  Summary: EMG/NCS was performed on the bilateral upper extremities. All nerves (as indicated in the following tables) were within normal limits.  The right opponens pollicis showed increased motor unit amplitude and reduced motor unit recruitment and the R Pronator terres showed reduced motor unit recruitment. All remaining muscles (as indicated in the following tables) were within normal limits.  Unable to perform needle examination on cervical paraspinals as those are unreliable after surgery.   Conclusion: Nerve conduction studies were within normal limits, no evidence for mononeuropathy (Carpal Tunnel Syndrome) or polyneuropathy. EMG needle study showed chronic neurogenic changes likely from remote radiculopathy however no acute or ongoing denervation was seen.   Naomie Dean M.D.  [facility] [address] [place], [place] [address] Tel: [phone] Fax: [phone]   cc: Dr. Venetia Maxon     [facility]    Nerve / Sites Muscle Latency Ref. Amplitude Ref. Rel Amp Segments Distance Velocity Ref. Area    ms ms mV mV  %  cm m/s m/s mVms  R Median - APB     Wrist APB 3.2 ?4.4 9.1 ?4.0 100 Wrist - APB 7   25.8     Upper arm APB 6.6  8.9  97.3 Upper arm - Wrist 19 56 ?49 25.4  L Median - APB     Wrist APB 2.9 ?4.4 7.6 ?4.0 100 Wrist - APB 7   22.4     Upper arm APB 6.0  7.3  96.1 Upper arm - Wrist 19 60 ?49 21.8  R Ulnar - ADM     Wrist ADM 2.3 ?3.3 12.9 ?6.0 100 Wrist - ADM 7   34.3     B.Elbow ADM 5.1  11.3  87.5 B.Elbow - Wrist 17 60 ?49 32.2     A.Elbow ADM 6.9  10.6  93.9 A.Elbow - B.Elbow 10 56 ?49 28.4         A.Elbow - Wrist      L Ulnar - ADM     Wrist ADM 2.3 ?3.3 12.7 ?6.0 100 Wrist - ADM 7   34.1     B.Elbow ADM 5.1  11.1  87.5 B.Elbow - Wrist 18 64 ?49 32.2     A.Elbow ADM 6.8  11.5  104 A.Elbow - B.Elbow 10 58 ?49 31.9         A.Elbow - Wrist                 SNC    Nerve / Sites Rec. Site Peak Lat Ref.  Amp Ref. Segments Distance Peak Diff Ref.  ms ms V V  cm ms ms  R Median, Ulnar - Transcarpal comparison     Median Palm Wrist 2.1 ?2.2 68 ?35 Median Palm - Wrist 8       Ulnar Palm Wrist 1.9 ?2.2 18 ?12 Ulnar Palm - Wrist 8          Median Palm - Ulnar Palm  0.3 ?0.4  L Median, Ulnar - Transcarpal comparison     Median Palm Wrist 1.7 ?2.2 75 ?35 Median Palm - Wrist 8       Ulnar Palm Wrist 1.7 ?2.2 13 ?12 Ulnar Palm - Wrist 8          Median Palm - Ulnar Palm  0.0 ?0.4  R Median - Orthodromic (Dig II, Mid palm)     Dig II Wrist 2.9 ?3.4 19 ?10 Dig II - Wrist 13    L Median - Orthodromic (Dig II, Mid palm)     Dig II Wrist 2.7 ?3.4 24 ?10 Dig II - Wrist 13    R Ulnar - Orthodromic, (Dig V, Mid palm)     Dig V Wrist 2.5 ?3.1 9 ?5 Dig V - Wrist 11    L Ulnar - Orthodromic, (Dig V, Mid palm)     Dig V Wrist 2.4 ?3.1 6 ?5 Dig V - Wrist 52                   F  Wave    Nerve F Lat Ref.   ms ms  R Ulnar - ADM 27.1 ?32.0  L Ulnar - ADM 26.1 ?32.0         EMG full       EMG Summary Table    Spontaneous MUAP Recruitment  Muscle IA Fib PSW Fasc Other Amp Dur. Poly Pattern  R.  Deltoid Normal None None None _______ Normal Normal Normal Normal  R. Triceps brachii Normal None None None _______ Normal Normal Normal Normal  R. Biceps brachii Normal None None None _______ Normal Normal Normal Normal  R. Trapezius Normal None None None _______ Normal Normal Normal Normal  R. Supraspinatus Normal None None None _______ Normal Normal Normal Normal  R. Infraspinatus Normal None None None _______ Normal Normal Normal Normal  R. Rhomboid major Normal None None None _______ Normal Normal Normal Normal  R. Pronator teres Normal None None None _______ Normal Normal Normal Reduced  R. Opponens pollicis Normal None None None _______ Normal Normal 1+ Reduced

## 2019-07-25 NOTE — Progress Notes (Signed)
Right arm pain. See procedure note

## 2019-07-25 NOTE — Procedures (Signed)
Full Name: [name] Gender: Female MRN #: [id number] Date of Birth: [birth year]    Visit Date: 07/22/2019 09:10 Age: 78 Years 61 Months Old Examining Physician: Naomie Dean, MD  Referring Physician: Maeola Harman, MD   History; Patient here for evaluation of right arm pain and possible CTS. She reports she gets pains in the wrists occasionally, she points to the ulnar styloid on each wrist where the pain is located, denies any other pain or numbness/tingling in the fingers, and this is improved with splints, very localized, not an issue anymore.  The pain in the upper right extremity started in the shoulder. She was working (manual labor, lifting, cleaning) and she felt on the right (she points to the trap area on the right), started with Biofreeze topical treatments but it worsened,  it radiates from that spot down to the back of the arm and to the elbow.  Summary: EMG/NCS was performed on the bilateral upper extremities. All nerves (as indicated in the following tables) were within normal limits.  The right opponens pollicis showed increased motor unit amplitude and reduced motor unit recruitment and the R Pronator terres showed reduced motor unit recruitment. All remaining muscles (as indicated in the following tables) were within normal limits.  Unable to perform needle examination on cervical paraspinals as those are unreliable after surgery.   Conclusion: Nerve conduction studies were within normal limits, no evidence for mononeuropathy (Carpal Tunnel Syndrome) or polyneuropathy. EMG needle study showed chronic neurogenic changes likely from remote radiculopathy however no acute or ongoing denervation was seen.   Naomie Dean M.D.  [facility] [address] [place], [place] [address] Tel: [phone] Fax: [phone]   cc: Dr. Venetia Maxon     [facility]    Nerve / Sites Muscle Latency Ref. Amplitude Ref. Rel Amp Segments Distance Velocity Ref. Area    ms ms mV mV  %  cm m/s m/s mVms  R Median - APB     Wrist APB 3.2 ?4.4 9.1 ?4.0 100 Wrist - APB 7   25.8     Upper arm APB 6.6  8.9  97.3 Upper arm - Wrist 19 56 ?49 25.4  L Median - APB     Wrist APB 2.9 ?4.4 7.6 ?4.0 100 Wrist - APB 7   22.4     Upper arm APB 6.0  7.3  96.1 Upper arm - Wrist 19 60 ?49 21.8  R Ulnar - ADM     Wrist ADM 2.3 ?3.3 12.9 ?6.0 100 Wrist - ADM 7   34.3     B.Elbow ADM 5.1  11.3  87.5 B.Elbow - Wrist 17 60 ?49 32.2     A.Elbow ADM 6.9  10.6  93.9 A.Elbow - B.Elbow 10 56 ?49 28.4         A.Elbow - Wrist      L Ulnar - ADM     Wrist ADM 2.3 ?3.3 12.7 ?6.0 100 Wrist - ADM 7   34.1     B.Elbow ADM 5.1  11.1  87.5 B.Elbow - Wrist 18 64 ?49 32.2     A.Elbow ADM 6.8  11.5  104 A.Elbow - B.Elbow 10 58 ?49 31.9         A.Elbow - Wrist                 SNC    Nerve / Sites Rec. Site Peak Lat Ref.  Amp Ref. Segments Distance Peak Diff Ref.  ms ms V V  cm ms ms  R Median, Ulnar - Transcarpal comparison     Median Palm Wrist 2.1 ?2.2 68 ?35 Median Palm - Wrist 8       Ulnar Palm Wrist 1.9 ?2.2 18 ?12 Ulnar Palm - Wrist 8          Median Palm - Ulnar Palm  0.3 ?0.4  L Median, Ulnar - Transcarpal comparison     Median Palm Wrist 1.7 ?2.2 75 ?35 Median Palm - Wrist 8       Ulnar Palm Wrist 1.7 ?2.2 13 ?12 Ulnar Palm - Wrist 8          Median Palm - Ulnar Palm  0.0 ?0.4  R Median - Orthodromic (Dig II, Mid palm)     Dig II Wrist 2.9 ?3.4 19 ?10 Dig II - Wrist 13    L Median - Orthodromic (Dig II, Mid palm)     Dig II Wrist 2.7 ?3.4 24 ?10 Dig II - Wrist 13    R Ulnar - Orthodromic, (Dig V, Mid palm)     Dig V Wrist 2.5 ?3.1 9 ?5 Dig V - Wrist 11    L Ulnar - Orthodromic, (Dig V, Mid palm)     Dig V Wrist 2.4 ?3.1 6 ?5 Dig V - Wrist 52                   F  Wave    Nerve F Lat Ref.   ms ms  R Ulnar - ADM 27.1 ?32.0  L Ulnar - ADM 26.1 ?32.0         EMG full       EMG Summary Table    Spontaneous MUAP Recruitment  Muscle IA Fib PSW Fasc Other Amp Dur. Poly Pattern  R.  Deltoid Normal None None None _______ Normal Normal Normal Normal  R. Triceps brachii Normal None None None _______ Normal Normal Normal Normal  R. Biceps brachii Normal None None None _______ Normal Normal Normal Normal  R. Trapezius Normal None None None _______ Normal Normal Normal Normal  R. Supraspinatus Normal None None None _______ Normal Normal Normal Normal  R. Infraspinatus Normal None None None _______ Normal Normal Normal Normal  R. Rhomboid major Normal None None None _______ Normal Normal Normal Normal  R. Pronator teres Normal None None None _______ Normal Normal Normal Reduced  R. Opponens pollicis Normal None None None _______ Normal Normal 1+ Reduced

## 2019-07-28 ENCOUNTER — Telehealth: Payer: Self-pay | Admitting: *Deleted

## 2019-07-28 DIAGNOSIS — M5021 Other cervical disc displacement,  high cervical region: Secondary | ICD-10-CM | POA: Diagnosis not present

## 2019-07-28 DIAGNOSIS — M4802 Spinal stenosis, cervical region: Secondary | ICD-10-CM | POA: Diagnosis not present

## 2019-07-28 DIAGNOSIS — M5414 Radiculopathy, thoracic region: Secondary | ICD-10-CM | POA: Diagnosis not present

## 2019-07-28 DIAGNOSIS — M5124 Other intervertebral disc displacement, thoracic region: Secondary | ICD-10-CM | POA: Diagnosis not present

## 2019-07-28 NOTE — Telephone Encounter (Signed)
Per [place] [place] [facility] on the line asking for EMG procedure note faxed asap to [phone] in office. EMG was faxed. Received a receipt of confirmation.

## 2019-08-19 ENCOUNTER — Encounter: Payer: Medicare Other | Admitting: Diagnostic Neuroimaging

## 2019-09-27 DIAGNOSIS — M5124 Other intervertebral disc displacement, thoracic region: Secondary | ICD-10-CM | POA: Diagnosis not present

## 2019-09-27 DIAGNOSIS — M542 Cervicalgia: Secondary | ICD-10-CM | POA: Diagnosis not present

## 2019-09-27 DIAGNOSIS — I10 Essential (primary) hypertension: Secondary | ICD-10-CM | POA: Diagnosis not present

## 2019-09-27 DIAGNOSIS — M5414 Radiculopathy, thoracic region: Secondary | ICD-10-CM | POA: Diagnosis not present

## 2019-09-29 DIAGNOSIS — H04123 Dry eye syndrome of bilateral lacrimal glands: Secondary | ICD-10-CM | POA: Diagnosis not present

## 2019-09-29 DIAGNOSIS — H40013 Open angle with borderline findings, low risk, bilateral: Secondary | ICD-10-CM | POA: Diagnosis not present

## 2019-10-07 DIAGNOSIS — Z20828 Contact with and (suspected) exposure to other viral communicable diseases: Secondary | ICD-10-CM | POA: Diagnosis not present

## 2019-11-09 DIAGNOSIS — M1711 Unilateral primary osteoarthritis, right knee: Secondary | ICD-10-CM | POA: Diagnosis not present

## 2019-11-30 ENCOUNTER — Other Ambulatory Visit (HOSPITAL_BASED_OUTPATIENT_CLINIC_OR_DEPARTMENT_OTHER): Payer: Self-pay | Admitting: Family Medicine

## 2019-11-30 DIAGNOSIS — Z1231 Encounter for screening mammogram for malignant neoplasm of breast: Secondary | ICD-10-CM

## 2019-12-03 ENCOUNTER — Ambulatory Visit (HOSPITAL_BASED_OUTPATIENT_CLINIC_OR_DEPARTMENT_OTHER)
Admission: RE | Admit: 2019-12-03 | Discharge: 2019-12-03 | Disposition: A | Discharge status: Home / Self Care | Payer: Medicare Other | Source: Ambulatory Visit | Attending: Family Medicine | Admitting: Family Medicine

## 2019-12-03 ENCOUNTER — Other Ambulatory Visit: Payer: Self-pay

## 2019-12-03 DIAGNOSIS — Z1231 Encounter for screening mammogram for malignant neoplasm of breast: Secondary | ICD-10-CM | POA: Insufficient documentation

## 2019-12-22 ENCOUNTER — Other Ambulatory Visit: Payer: Self-pay

## 2019-12-22 ENCOUNTER — Emergency Department (HOSPITAL_BASED_OUTPATIENT_CLINIC_OR_DEPARTMENT_OTHER)
Admission: EM | Admit: 2019-12-22 | Discharge: 2019-12-22 | Disposition: A | Discharge status: Home / Self Care | Payer: Medicare Other | Attending: Emergency Medicine | Admitting: Emergency Medicine

## 2019-12-22 ENCOUNTER — Encounter (HOSPITAL_BASED_OUTPATIENT_CLINIC_OR_DEPARTMENT_OTHER): Payer: Self-pay

## 2019-12-22 DIAGNOSIS — Z7982 Long term (current) use of aspirin: Secondary | ICD-10-CM | POA: Diagnosis not present

## 2019-12-22 DIAGNOSIS — Z79899 Other long term (current) drug therapy: Secondary | ICD-10-CM | POA: Diagnosis not present

## 2019-12-22 DIAGNOSIS — I5032 Chronic diastolic (congestive) heart failure: Secondary | ICD-10-CM | POA: Diagnosis not present

## 2019-12-22 DIAGNOSIS — M79602 Pain in left arm: Secondary | ICD-10-CM | POA: Diagnosis not present

## 2019-12-22 DIAGNOSIS — I110 Hypertensive heart disease with heart failure: Secondary | ICD-10-CM | POA: Diagnosis not present

## 2019-12-22 MED ORDER — OXYCODONE-ACETAMINOPHEN 5-325 MG PO TABS
1.0000 | ORAL_TABLET | Freq: Once | ORAL | Status: AC
  Administered 2019-12-22: 14:00:00 1 via ORAL
  Filled 2019-12-22: qty 1

## 2019-12-22 MED ORDER — PREDNISONE 20 MG PO TABS: 40.0000 mg | ORAL_TABLET | Freq: Every day | ORAL | 0 refills | Status: AC

## 2019-12-22 MED ORDER — PREDNISONE 50 MG PO TABS
60.0000 mg | ORAL_TABLET | Freq: Once | ORAL | Status: AC
  Administered 2019-12-22: 60 mg via ORAL
  Filled 2019-12-22: qty 1

## 2019-12-22 MED ORDER — HYDROCODONE-ACETAMINOPHEN 5-325 MG PO TABS: 1.0000 | ORAL_TABLET | Freq: Four times a day (QID) | ORAL | 0 refills | Status: DC | PRN

## 2019-12-22 MED FILL — HYDROCODON-APAP 5-325: 5-325 | 2 days supply | Qty: 6 | Fill #0

## 2019-12-22 MED FILL — predniSONE 20 MG TABS: 20 | 4 days supply | Qty: 8 | Fill #0

## 2019-12-22 NOTE — ED Triage Notes (Addendum)
Pt c/o pain to left UE started last night-BP elevated this am-denies injury to left UE-states she took ASA 81mg -NAD-steady gait-pt later added that she has a worsening condition to cervical spine that will require additional surgery and was advised to get in touch with ortho if develops pain to left hand- states she did not contact ortho due to pain is to left arm

## 2019-12-22 NOTE — ED Provider Notes (Signed)
[facility] EMERGENCY DEPARTMENT Provider Note   CSN: [id number] Arrival date & time: 12/22/19  1202     History Chief Complaint  Patient presents with  . Arm Pain    [name] is a 79 y.o. female with PMH/o CHF, bradycardia, HLD, HTN, GERD, chronic neck pain who presents for evaluation of left upper extremity pain.  Patient reports that last night, she noticed that before she went to bed her left upper extremity felt "funny."  She states that she had been fine throughout the day.  She does report that she had been moving a refrigerator all day.  She reports that she woke up this morning and felt like a "vice was around her arm."  She reports that as the day wore on, she had worsening pain and tightness to the left upper extremity.  She states that she took an aspirin at home.  She went to her daughter's house and noted that her blood pressure was elevated.  She took an aspirin.  She reports that since coming the ED, the pain has eased off.  She does report some numbness to the left upper extremity which she states she did not notice until she got to the emergency department..  She states pain is worse with movement.  She states no trauma, injury, fall.  Patient states that she did not have any chest pain, abdominal pain, nausea/vomiting, diaphoresis.  She reports she has a history of chronic neck pain.  She follows with neurosurgeon was told that eventually she might need surgery again.  She currently denies any shortness of breath.  She states that over the last year, she has noticed that when she walks long distances, she would get more out of breath than normal.  The history is provided by the patient.       Past Medical History:  Diagnosis Date  . Bradycardia 02/28/2017  . Chronic diastolic CHF (congestive heart failure) (HCC)   . Constipation   . Diastolic dysfunction   . Essential hypertension 05/06/2017  . Essential hypertension, benign 01/05/2014  . Gastric  reflux   . GERD (gastroesophageal reflux disease)   . Hyperlipemia    dyslipidemia  . Hypertension   . Knee pain    bilateral  . Obesity   . Shoulder pain    right shoulder  . SOB (shortness of breath) on exertion   . Swelling    feet and legs  . Vitamin D deficiency 05/06/2017    Patient Active Problem List   Diagnosis Date Noted  . Acute diverticulitis 05/31/2018  . Near syncope   . Class 1 obesity with serious comorbidity and body mass index (BMI) of 32.0 to 32.9 in adult 05/06/2017  . Vitamin D deficiency 05/06/2017  . Bradycardia 02/28/2017  . Hyperlipemia   . Diastolic dysfunction   . Chronic diastolic CHF (congestive heart failure) (HCC)   . Essential hypertension, benign 01/05/2014    Past Surgical History:  Procedure Laterality Date  . ABDOMINAL HYSTERECTOMY    . APPENDECTOMY  1977  . CARDIAC CATHETERIZATION  02/2009   no evidence of CAD/ Elevated LVEDP at the time ofcath c/w diastolic dysfunction  . KNEE SURGERY  1986  . NECK SURGERY    . OOPHORECTOMY  1972  . SHOULDER ARTHROSCOPY       OB History    Gravida  3   Para  3   Term  3   Preterm      AB  Living  3     SAB      TAB      Ectopic      Multiple      Live Births              Family History  Problem Relation Age of Onset  . Heart failure Mother   . Diabetes Mother   . Hypertension Mother   . Hyperlipidemia Mother   . Stroke Mother   . Heart disease Mother   . CAD Father   . Heart attack Father   . Hypertension Father   . Hyperlipidemia Father   . Heart disease Father   . Sudden death Father   . CAD Sister   . CAD Brother   . Pancreatic cancer Brother   . CAD Brother   . CAD Brother   . Heart failure Sister   . Other Brother        OPEN HEART  . Other Sister        OPEN HEART    Social History   Tobacco Use  . Smoking status: Never Smoker  . Smokeless tobacco: Never Used  Substance Use Topics  . Alcohol use: No  . Drug use: No    Home  Medications Prior to Admission medications   Medication Sig Start Date End Date Taking? Authorizing Provider  acetaminophen (TYLENOL) 650 MG CR tablet Take 1,300 mg by mouth every 8 (eight) hours as needed for pain.    [provider]  aspirin EC 81 MG tablet Take 81 mg by mouth at bedtime.    [provider]  furosemide (LASIX) 20 MG tablet TAKE 1 TABLET BY MOUTH DAILY 04/19/19   Quintella Reichert, MD  HYDROcodone-acetaminophen (NORCO/VICODIN) 5-325 MG tablet Take 1-2 tablets by mouth every 6 (six) hours as needed. 12/22/19   Maxwell Caul, PA-C  lisinopril (ZESTRIL) 10 MG tablet Take 1 tablet (10 mg total) by mouth daily. Please make overdue appt with Dr. Mayford Knife before anymore refills. 2nd attempt 06/14/19   Quintella Reichert, MD  lovastatin (MEVACOR) 40 MG tablet Take 0.5 tablets (20 mg total) by mouth at bedtime. Please make overdue appt with Dr. Mayford Knife before anymore refills. 2nd attempt 06/14/19   Quintella Reichert, MD  meloxicam (MOBIC) 7.5 MG tablet Take 7.5 mg by mouth daily.     [provider]  Multiple Vitamin (MULTIVITAMIN WITH MINERALS) TABS tablet Take 1 tablet by mouth daily.    [provider]  Omega-3 Fatty Acids (FISH OIL) 1000 MG CAPS Take 1,000 mg by mouth 2 (two) times daily.    [provider]  omeprazole (PRILOSEC) 20 MG capsule Take 20 mg by mouth daily.    [provider]  Polyvinyl Alcohol-Povidone (REFRESH OP) Place 1 drop into both eyes daily as needed (dry eyes).    [provider]  predniSONE (DELTASONE) 20 MG tablet Take 2 tablets (40 mg total) by mouth daily for 4 days. 12/22/19 12/26/19  Maxwell Caul, PA-C  traMADol (ULTRAM) 50 MG tablet Take 50 mg by mouth every 6 (six) hours as needed. 07/05/19   [provider]  zolpidem (AMBIEN) 5 MG tablet Take 5 mg by mouth at bedtime.     [provider]    Allergies    Morphine and related  Review of Systems   Review of Systems    Constitutional: Negative for fever.  Respiratory: Negative for cough and shortness of breath.   Cardiovascular: Negative  for chest pain.  Gastrointestinal: Negative for abdominal pain, nausea and vomiting.  Musculoskeletal:       LUE pain  Neurological: Positive for numbness. Negative for weakness and headaches.  All other systems reviewed and are negative.   Physical Exam Updated Vital Signs BP 120/71 (BP Location: Right Arm)   Pulse 63   Temp 98.3 F (36.8 C) (Oral)   Resp 19   Ht 5\' 3"  (1.6 m)   Wt 90.7 kg   SpO2 100%   BMI 35.43 kg/m   Physical Exam Vitals and nursing note reviewed.  Constitutional:      Appearance: Normal appearance. She is well-developed.  HENT:     Head: Normocephalic and atraumatic.  Eyes:     General: Lids are normal.     Conjunctiva/sclera: Conjunctivae normal.     Pupils: Pupils are equal, round, and reactive to light.  Neck:     Comments: Positive Spurling's maneuver. Cardiovascular:     Rate and Rhythm: Normal rate and regular rhythm.     Pulses: Normal pulses.          Radial pulses are 2+ on the right side and 2+ on the left side.     Heart sounds: Normal heart sounds. No murmur. No friction rub. No gallop.   Pulmonary:     Effort: Pulmonary effort is normal.     Breath sounds: Normal breath sounds.     Comments: Lungs clear to auscultation bilaterally.  Symmetric chest rise.  No wheezing, rales, rhonchi. Abdominal:     Palpations: Abdomen is soft. Abdomen is not rigid.     Tenderness: There is no abdominal tenderness. There is no guarding.     Comments: Abdomen is soft, non-distended, non-tender. No rigidity, No guarding. No peritoneal signs.  Musculoskeletal:        General: Normal range of motion.     Cervical back: Full passive range of motion without pain.     Comments: Diffuse tenderness noted to the left upper extremity with no focal bony tenderness.  No deformity or crepitus noted.  Pain is reproduced with movement of her  shoulder.  Flexion/tension of her left elbow and wrist intact with any difficulty.  No tenderness palpation noted right upper extremity.  Skin:    General: Skin is warm and dry.     Capillary Refill: Capillary refill takes less than 2 seconds.     Comments: Good distal cap refill.  LUE is not dusky in appearance or cool to touch.  Neurological:     Mental Status: She is alert and oriented to person, place, and time.     Comments: She reports decreased sensation noted that starts at the left shoulder and extends distally. 5/5 strength bilateral upper and lower extremity. Equal grip strength.  CN II-XII intact without any difficulty   Psychiatric:        Speech: Speech normal.     ED Results / Procedures / Treatments   Labs (all labs ordered are listed, but only abnormal results are displayed) Labs Reviewed - No data to display  EKG EKG Interpretation  Date/Time:  Wednesday December 22 2019 12:18:49 EDT Ventricular Rate:  63 PR Interval:  174 QRS Duration: 74 QT Interval:  418 QTC Calculation: 427 R Axis:   -8 Text Interpretation: Normal sinus rhythm Septal infarct , age undetermined Abnormal ECG No significant change since last tracing Confirmed by Theotis Burrow [phone]) on 12/22/2019 1:57:48 PM   Radiology No results found.  Procedures Procedures (  including critical care time)  Medications Ordered in ED Medications  oxyCODONE-acetaminophen (PERCOCET/ROXICET) 5-325 MG per tablet 1 tablet (1 tablet Oral Given 12/22/19 1412)  predniSONE (DELTASONE) tablet 60 mg (60 mg Oral Given 12/22/19 1412)    ED Course  I have reviewed the triage vital signs and the nursing notes.  Pertinent labs & imaging results that were available during my care of the patient were reviewed by me and considered in my medical decision making (see chart for details).    MDM Rules/Calculators/A&P                      79 year old female who presents for evaluation of left upper extremity pain.  She  reports that initially last night, she noticed that started feeling slightly funny.  This morning, she woke up and had worsening pain.  She felt like a vice was on her arm.  She does report that yesterday she was moving a refrigerator.  No fevers, numbness/weakness at home.  No chest pain.  She does report that at home she noted her blood pressure be elevated which prompted her to come to the emergency department.  On initial ED arrival, she is afebrile, nontoxic-appearing.  Vital signs are stable.  She is slightly hypertensive.  This is likely secondary to pain.  Patient with good distal pulses, cap refill.  Her pain is worsened with movement of her left upper extremity.  Additionally, she has diffuse tenderness.  I suspect this is most likely musculoskeletal in nature with radiculopathy, likely exacerbated by activities yesterday.  On my exam, she has no weakness and has equal grip strength.  No indication for acute MRIs do not suspect acute CVA as etiology of her symptoms and she exhibits no focal weakness here in the ED.  Additionally, history/physical exam not concerning for ACS etiology, hypertensive emergency.  I discussed with Dr. Clarene Duke who also evaluated patient agrees with plan.  We will plan to send her home with short course of pain medication, steroids.  Patient is established with Dr. Venetia Maxon (neurosurgery).  Instructed patient to follow-up with him as directed. At this time, patient exhibits no emergent life-threatening condition that require further evaluation in ED or admission. Patient had ample opportunity for questions and discussion. All patient's questions were answered with full understanding. Strict return precautions discussed. Patient expresses understanding and agreement to plan.   Portions of this note were generated with Scientist, clinical (histocompatibility and immunogenetics). Dictation errors may occur despite best attempts at proofreading.   Final Clinical Impression(s) / ED Diagnoses Final diagnoses:  Left  arm pain    Rx / DC Orders ED Discharge Orders         Ordered    predniSONE (DELTASONE) 20 MG tablet  Daily     12/22/19 1427    HYDROcodone-acetaminophen (NORCO/VICODIN) 5-325 MG tablet  Every 6 hours PRN     12/22/19 1427           Maxwell Caul, PA-C 12/22/19 1514    Little, Ambrose Finland, MD 12/23/19 [phone]

## 2019-12-22 NOTE — Discharge Instructions (Signed)
Take prednisone as directed.   Take pain medications as directed for break through pain. Do not drive or operate machinery while taking this medication.   As we discussed, follow-up with Dr. Fredrich Birks office.  Call and make an appointment.  Return the emergency department any worsening pain, numbness or weakness, inability to move the arm, chest pain, difficulty breathing or any other worsening or concerning symptoms.

## 2020-01-26 DIAGNOSIS — M542 Cervicalgia: Secondary | ICD-10-CM | POA: Diagnosis not present

## 2020-02-28 DIAGNOSIS — M5021 Other cervical disc displacement,  high cervical region: Secondary | ICD-10-CM | POA: Diagnosis not present

## 2020-02-28 DIAGNOSIS — M5412 Radiculopathy, cervical region: Secondary | ICD-10-CM | POA: Diagnosis not present

## 2020-02-28 DIAGNOSIS — M542 Cervicalgia: Secondary | ICD-10-CM | POA: Diagnosis not present

## 2020-02-28 DIAGNOSIS — H40013 Open angle with borderline findings, low risk, bilateral: Secondary | ICD-10-CM | POA: Diagnosis not present

## 2020-02-28 DIAGNOSIS — H04123 Dry eye syndrome of bilateral lacrimal glands: Secondary | ICD-10-CM | POA: Diagnosis not present

## 2020-02-28 DIAGNOSIS — H35033 Hypertensive retinopathy, bilateral: Secondary | ICD-10-CM | POA: Diagnosis not present

## 2020-02-28 DIAGNOSIS — H35373 Puckering of macula, bilateral: Secondary | ICD-10-CM | POA: Diagnosis not present

## 2020-02-28 DIAGNOSIS — M5414 Radiculopathy, thoracic region: Secondary | ICD-10-CM | POA: Diagnosis not present

## 2020-03-14 IMAGING — MG DIGITAL SCREENING BILATERAL MAMMOGRAM WITH TOMO AND CAD
8 series · 8 of 24 positions shown · non-contrast
Comparison: Previous exam(s).

CLINICAL DATA: Screening.

EXAM:
DIGITAL SCREENING BILATERAL MAMMOGRAM WITH TOMO AND CAD

[R CC synth-2D]
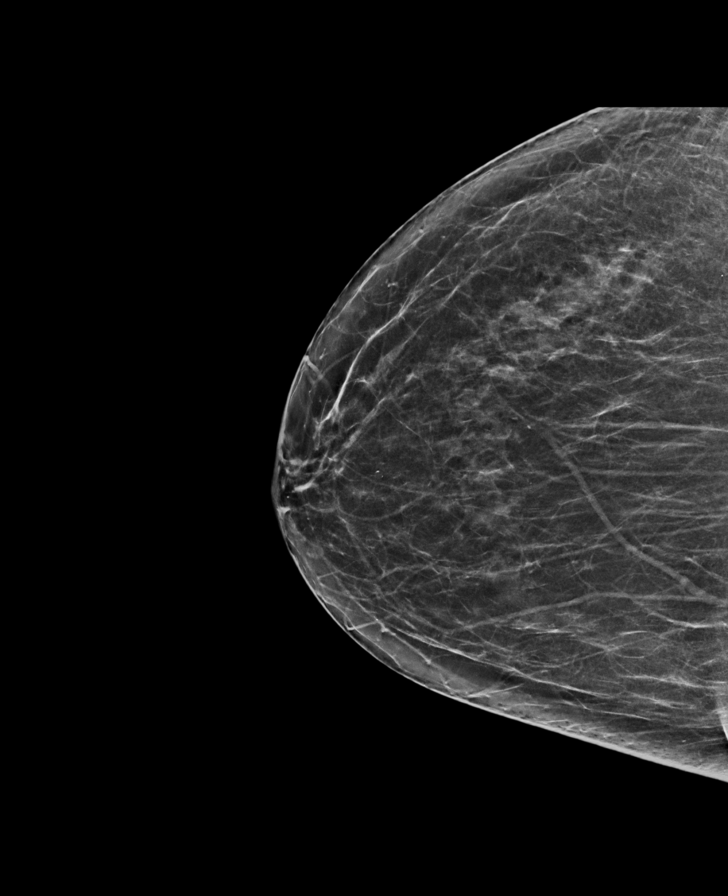

[L CC synth-2D]
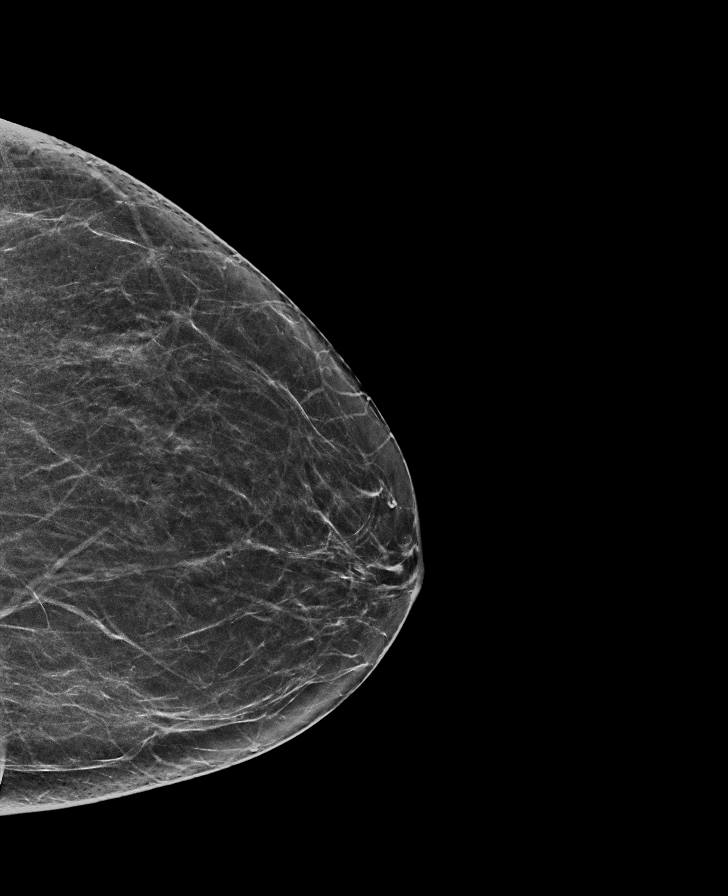

[R MLO synth-2D]
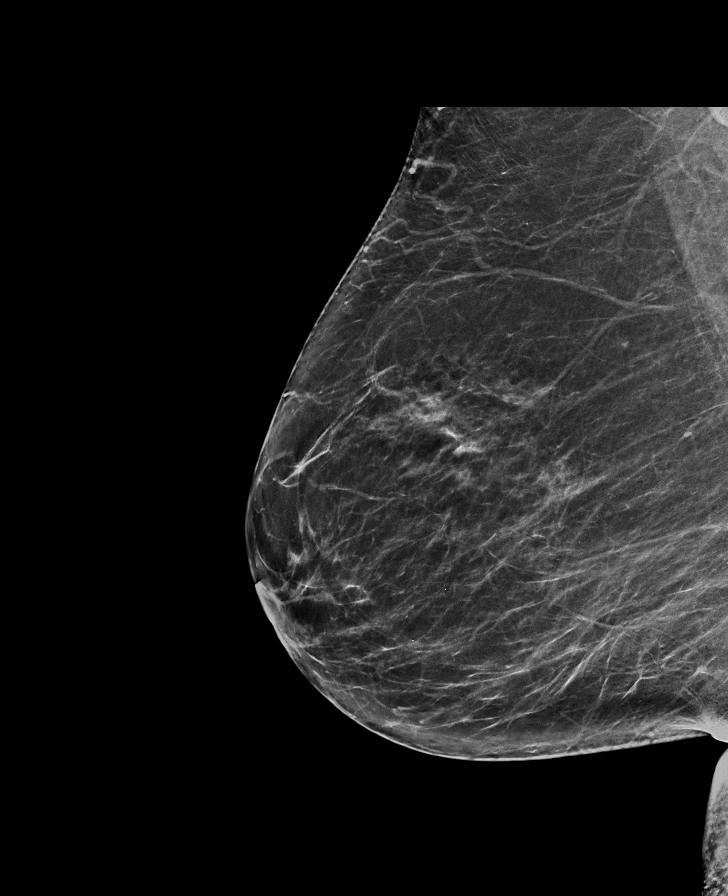

[L MLO synth-2D]
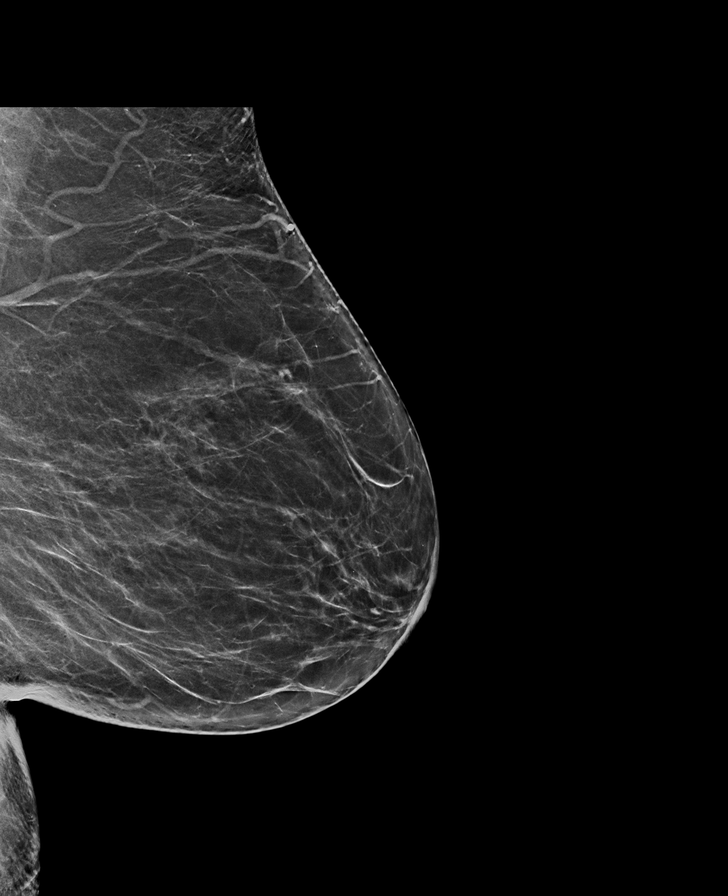

[L MLO tomo · tomo slice 34/67.0]
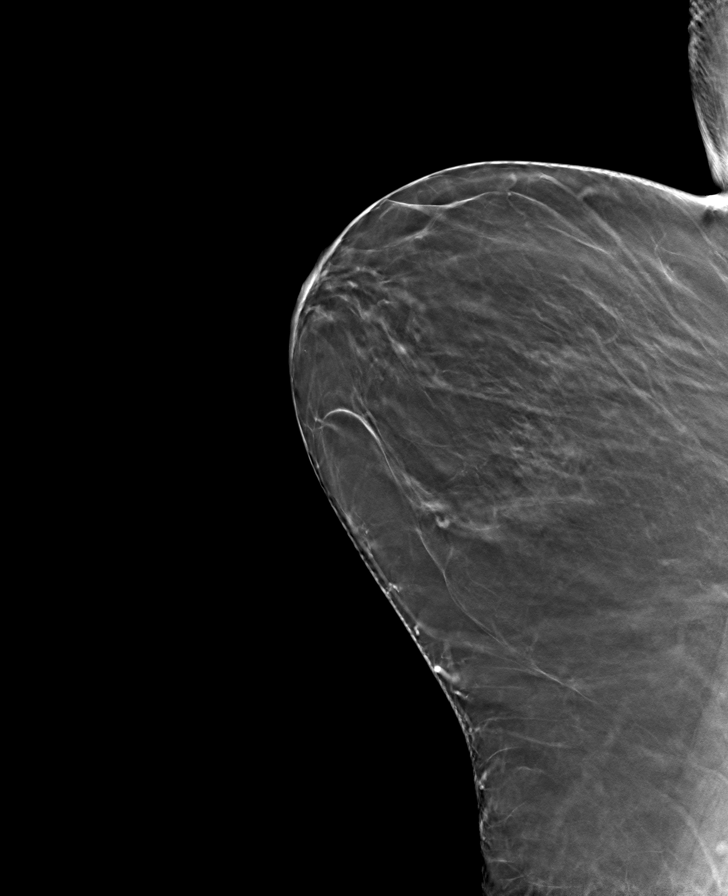

[L CC tomo · tomo slice 31/60.0]
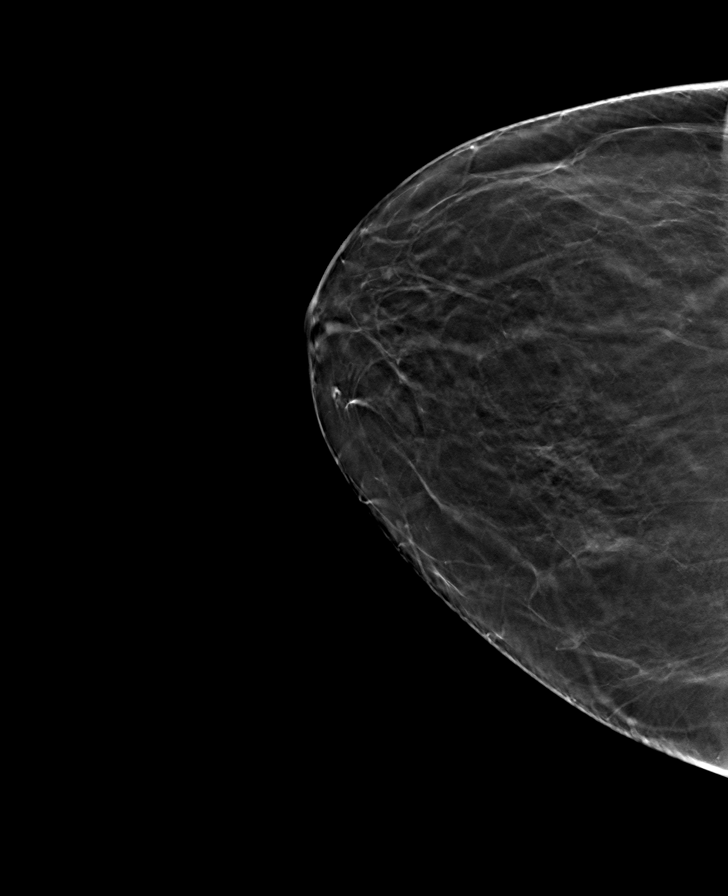

[R CC tomo · tomo slice 32/63.0]
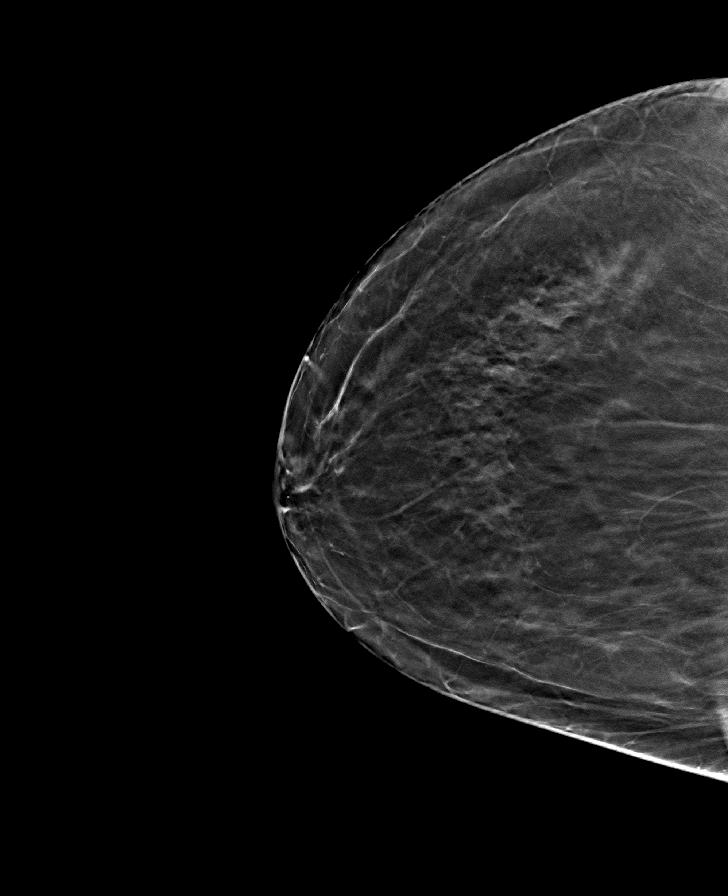

[R MLO tomo · tomo slice 36/71.0]
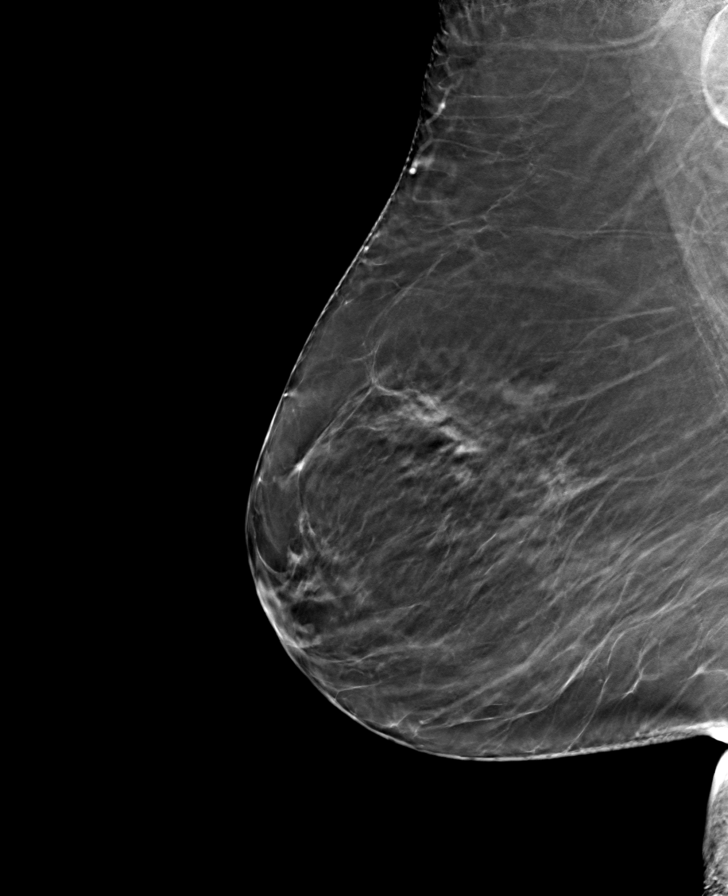

[8 of 24 positions shown; findings below may reference images not displayed]

ACR Breast Density Category b: There are scattered areas of
fibroglandular density.
FINDINGS: There are no findings suspicious for malignancy. Images were
processed with CAD.
IMPRESSION: No mammographic evidence of malignancy. A result letter of this
screening mammogram will be mailed directly to the patient.

RECOMMENDATION:
Screening mammogram in one year. (Code:[id number])

BI-RADS CATEGORY  1: Negative.

## 2020-03-20 DIAGNOSIS — M1711 Unilateral primary osteoarthritis, right knee: Secondary | ICD-10-CM | POA: Diagnosis not present

## 2020-03-29 DIAGNOSIS — M25561 Pain in right knee: Secondary | ICD-10-CM | POA: Diagnosis not present

## 2020-04-11 DIAGNOSIS — M1711 Unilateral primary osteoarthritis, right knee: Secondary | ICD-10-CM | POA: Diagnosis not present

## 2020-04-18 DIAGNOSIS — M1711 Unilateral primary osteoarthritis, right knee: Secondary | ICD-10-CM | POA: Diagnosis not present

## 2020-04-25 DIAGNOSIS — M1711 Unilateral primary osteoarthritis, right knee: Secondary | ICD-10-CM | POA: Diagnosis not present

## 2020-05-10 DIAGNOSIS — K219 Gastro-esophageal reflux disease without esophagitis: Secondary | ICD-10-CM | POA: Diagnosis not present

## 2020-05-10 DIAGNOSIS — I10 Essential (primary) hypertension: Secondary | ICD-10-CM | POA: Diagnosis not present

## 2020-05-10 DIAGNOSIS — E785 Hyperlipidemia, unspecified: Secondary | ICD-10-CM | POA: Diagnosis not present

## 2020-05-10 DIAGNOSIS — Z0000 Encounter for general adult medical examination without abnormal findings: Secondary | ICD-10-CM | POA: Diagnosis not present

## 2020-06-06 DIAGNOSIS — M1711 Unilateral primary osteoarthritis, right knee: Secondary | ICD-10-CM | POA: Diagnosis not present

## 2020-06-07 DIAGNOSIS — Z01812 Encounter for preprocedural laboratory examination: Secondary | ICD-10-CM | POA: Diagnosis not present

## 2020-06-07 DIAGNOSIS — M1711 Unilateral primary osteoarthritis, right knee: Secondary | ICD-10-CM | POA: Diagnosis not present

## 2020-06-07 DIAGNOSIS — M25561 Pain in right knee: Secondary | ICD-10-CM | POA: Diagnosis not present

## 2020-06-07 DIAGNOSIS — R791 Abnormal coagulation profile: Secondary | ICD-10-CM | POA: Diagnosis not present

## 2020-06-13 ENCOUNTER — Telehealth: Payer: Self-pay

## 2020-06-13 NOTE — Telephone Encounter (Signed)
Patient has an appt with Jacolyn Reedy tomorrow. She will address pre-op clearance at that visit. I will remove request from pre-op pool.

## 2020-06-13 NOTE — Progress Notes (Signed)
Cardiology Office Note    Date:  06/14/2020   ID:  [name], DOB [birth year], MRN [id number]  PCP:  Joycelyn Rua, MD  Cardiologist: Armanda Magic, MD EPS: None  Chief Complaint  Patient presents with  . Pre-op Exam    History of Present Illness:  [name] is a 79 y.o. female a hx of chronic diastolic CHF, normal LVF, normal coronary arteries by cath 2010, HTN and dyslipidemia.  Last saw Dr. Mayford Knife 07/2019 and heart doing well.  Patient here for surgical clearance RTK replacement 06/30/20 by Dr. Salvatore Marvel.  Patient does a lot of farming/gardening, cleans house. Gained some weight through the pandemic. She has some dyspnea on exertion when walking to the barn since weight gain but stays very active. Denies chest pain, palpitations, edema, dizziness or presyncope.   Past Medical History:  Diagnosis Date  . Bradycardia 02/28/2017  . Chronic diastolic CHF (congestive heart failure) (HCC)   . Constipation   . Diastolic dysfunction   . Essential hypertension 05/06/2017  . Essential hypertension, benign 01/05/2014  . Gastric reflux   . GERD (gastroesophageal reflux disease)   . Hyperlipemia    dyslipidemia  . Hypertension   . Knee pain    bilateral  . Obesity   . Shoulder pain    right shoulder  . SOB (shortness of breath) on exertion   . Swelling    feet and legs  . Vitamin D deficiency 05/06/2017    Past Surgical History:  Procedure Laterality Date  . ABDOMINAL HYSTERECTOMY    . APPENDECTOMY  1977  . CARDIAC CATHETERIZATION  02/2009   no evidence of CAD/ Elevated LVEDP at the time ofcath c/w diastolic dysfunction  . KNEE SURGERY  1986  . NECK SURGERY    . OOPHORECTOMY  1972  . SHOULDER ARTHROSCOPY      Current Medications: Current Meds  Medication Sig  . acetaminophen (TYLENOL) 650 MG CR tablet Take 1,300 mg by mouth every 8 (eight) hours as needed for pain.  Marland Kitchen aspirin EC 81 MG tablet Take 81 mg by mouth at bedtime.  . furosemide  (LASIX) 20 MG tablet TAKE 1 TABLET BY MOUTH DAILY  . gabapentin (NEURONTIN) 300 MG capsule Take 300 mg by mouth at bedtime.  Marland Kitchen HYDROcodone-acetaminophen (NORCO/VICODIN) 5-325 MG tablet Take 1-2 tablets by mouth every 6 (six) hours as needed.  Marland Kitchen lisinopril (ZESTRIL) 10 MG tablet Take 1 tablet (10 mg total) by mouth daily. Please make overdue appt with Dr. Mayford Knife before anymore refills. 2nd attempt  . lovastatin (MEVACOR) 40 MG tablet Take 0.5 tablets (20 mg total) by mouth at bedtime. Please make overdue appt with Dr. Mayford Knife before anymore refills. 2nd attempt (Patient taking differently: Take 40 mg by mouth at bedtime. Please make overdue appt with Dr. Mayford Knife before anymore refills. 2nd attempt)  . Multiple Vitamin (MULTIVITAMIN WITH MINERALS) TABS tablet Take 1 tablet by mouth daily.  . Omega-3 Fatty Acids (FISH OIL) 1000 MG CAPS Take 1,000 mg by mouth 2 (two) times daily.  Marland Kitchen omeprazole (PRILOSEC) 20 MG capsule Take 20 mg by mouth daily.  . Polyvinyl Alcohol-Povidone (REFRESH OP) Place 1 drop into both eyes daily as needed (dry eyes).  . predniSONE (DELTASONE) 5 MG tablet Take 5 mg by mouth daily.  Marland Kitchen tiZANidine (ZANAFLEX) 2 MG tablet Take 2 mg by mouth at bedtime.  Marland Kitchen zolpidem (AMBIEN) 5 MG tablet Take 5 mg by mouth at bedtime.      Allergies:  Morphine and related   Social History   Socioeconomic History  . Marital status: Married    Spouse name: [name]  . Number of children: Not on file  . Years of education: Not on file  . Highest education level: Not on file  Occupational History  . Occupation: retired - housekeeper  Tobacco Use  . Smoking status: Never Smoker  . Smokeless tobacco: Never Used  Vaping Use  . Vaping Use: Never used  Substance and Sexual Activity  . Alcohol use: No  . Drug use: No  . Sexual activity: Not on file  Other Topics Concern  . Not on file  Social History Narrative  . Not on file   Social Determinants of Health   Financial Resource Strain:   .  Difficulty of Paying Living Expenses: Not on file  Food Insecurity:   . Worried About Programme researcher, broadcasting/film/video in the Last Year: Not on file  . Ran Out of Food in the Last Year: Not on file  Transportation Needs:   . Lack of Transportation (Medical): Not on file  . Lack of Transportation (Non-Medical): Not on file  Physical Activity:   . Days of Exercise per Week: Not on file  . Minutes of Exercise per Session: Not on file  Stress:   . Feeling of Stress : Not on file  Social Connections:   . Frequency of Communication with Friends and Family: Not on file  . Frequency of Social Gatherings with Friends and Family: Not on file  . Attends Religious Services: Not on file  . Active Member of Clubs or Organizations: Not on file  . Attends Banker Meetings: Not on file  . Marital Status: Not on file     Family History:  The patient's family history includes CAD in her brother, brother, brother, father, and sister; Diabetes in her mother; Heart attack in her father; Heart disease in her father and mother; Heart failure in her mother and sister; Hyperlipidemia in her father and mother; Hypertension in her father and mother; Other in her brother and sister; Pancreatic cancer in her brother; Stroke in her mother; Sudden death in her father.   ROS:   Please see the history of present illness.    ROS All other systems reviewed and are negative.   PHYSICAL EXAM:   VS:  BP (!) 146/72   Pulse 70   Ht 5\' 3"  (1.6 m)   Wt 208 lb 6.4 oz (94.5 kg)   SpO2 95%   BMI 36.92 kg/m   Physical Exam  GEN: Obese, in no acute distress  Neck: no JVD, carotid bruits, or masses Cardiac:RRR; 2/6 systolic murmur at the left sternal border into her carotids Respiratory:  clear to auscultation bilaterally, normal work of breathing GI: soft, nontender, nondistended, + BS Ext: without cyanosis, clubbing, or edema, Good distal pulses bilaterally Neuro:  Alert and Oriented x 3 Psych: euthymic mood, full  affect  Wt Readings from Last 3 Encounters:  06/14/20 208 lb 6.4 oz (94.5 kg)  12/22/19 200 lb (90.7 kg)  07/19/19 198 lb 12.8 oz (90.2 kg)      Studies/Labs Reviewed:   EKG:  EKG is not ordered today.    Recent Labs: 07/19/2019: ALT 28; BUN 22; Creatinine, Ser 0.69; Potassium 4.0; Sodium 138   Lipid Panel    Component Value Date/Time   CHOL 171 07/19/2019 1603   TRIG 127 07/19/2019 1603   HDL 47 07/19/2019 1603   CHOLHDL 3.6  07/19/2019 1603   CHOLHDL 4.5 03/18/2016 1000   VLDL 25 03/18/2016 1000   LDLCALC 101 (H) 07/19/2019 1603    Additional studies/ records that were reviewed today include:   Holter monitor 02/27/2015 Study Highlights   Sinus bradycardia and Normal Sinus Rhythm with average heart rate 59 bpm. Heart rate ranged from 52-68 bpm  Occasional PAC's    Lexiscan Myoview 6/20/2016Study Highlights   Upsloping ST segment depression ST segment depression was noted during stress in the inferior and inferolateral leads (II, III, aVF, V5 and V6), and returning to baseline after less than 1 minute of recovery.  No T wave inversion was noted during stress.  The study is normal.  This is a low risk study.  The left ventricular ejection fraction is normal (55-65%).  6/20/2016Study Conclusions   - Left ventricle: The cavity size was normal. There was mild    concentric hypertrophy. Systolic function was normal. The    estimated ejection fraction was in the range of 60% to 65%. Wall    motion was normal; there were no regional wall motion    abnormalities. Doppler parameters are consistent with abnormal    left ventricular relaxation (grade 1 diastolic dysfunction).    Doppler parameters are consistent with elevated ventricular    end-diastolic filling pressure.  - Aortic valve: There was no regurgitation.  - Mitral valve: Mildly thickened leaflets . There was mild    regurgitation.  - Left atrium: The atrium was normal in size.  - Right ventricle:  Systolic function was normal.  - Tricuspid valve: There was mild regurgitation.  - Pulmonic valve: There was no regurgitation.  - Pulmonary arteries: Systolic pressure was within the normal    range.  - Inferior vena cava: The vessel was normal in size.  - Pericardium, extracardiac: There was no pericardial effusion.   Impressions:   - Normal biventricular size and systolic function.    Abnormal relaxation with mildly elevated filling pressures.    Mild mitral and tricuspid regurgitation.    Normal RVSP.    ASSESSMENT:    1. Preoperative clearance   2. Murmur, heart   3. Chronic diastolic CHF (congestive heart failure) (HCC)   4. Essential hypertension   5. Hyperlipidemia, unspecified hyperlipidemia type   6. Bradycardia      PLAN:  In order of problems listed above:  Preoperative clearance for total knee replacement by Dr. Salvatore Marvelobert Wainer 06/30/2020.  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  Has had a little bit of dyspnea on exertion that she believes is from her weight gain through the pandemic.  No chest pain or other cardiac symptoms.  We will check a 2D echo for heart murmur but does not have to be done prior to surgery. According to the Revised Cardiac Risk Index (RCRI), her Perioperative Risk of Major Cardiac Event is (%): 0.9  Her Functional Capacity in METs is: 6.61 according to the Duke Activity Status Index (DASI).  Systolic heart murmur and her carotids history of mild MR and TR in 2016.  We will update 2D echo.  Chronic diastolic CHF compensated without heart failure  Hypertension controlled with Zestril and Lasix recent lab work normal  Hyperlipidemia LDL 92 05/10/2020 and Mevacor increased to 40 mg daily  History of bradycardia heart rate 70 today  Medication Adjustments/Labs and Tests Ordered: Current medicines are reviewed at length with the patient today.  Concerns regarding medicines are outlined above.  Medication changes, Labs and Tests ordered  today are listed in the Patient Instructions below. Patient Instructions  Medication Instructions:  Your physician recommends that you continue on your current medications as directed. Please refer to the Current Medication list given to you today.  *If you need a refill on your cardiac medications before your next appointment, please call your pharmacy*   Lab Work: None If you have labs (blood work) drawn today and your tests are completely normal, you will receive your results only by: Marland Kitchen MyChart Message (if you have MyChart) OR . A paper copy in the mail If you have any lab test that is abnormal or we need to change your treatment, we will call you to review the results.   Testing/Procedures: Your physician has requested that you have an echocardiogram. Echocardiography is a painless test that uses sound waves to create images of your heart. It provides your doctor with information about the size and shape of your heart and how well your heart's chambers and valves are working. This procedure takes approximately one hour. There are no restrictions for this procedure.  Follow-Up: At [facility], you and your health needs are our priority.  As part of our continuing mission to provide you with exceptional heart care, we have created designated Provider Care Teams.  These Care Teams include your primary Cardiologist (physician) and Advanced Practice Providers (APPs -  Physician Assistants and Nurse Practitioners) who all work together to provide you with the care you need, when you need it.  Your next appointment:   1 year(s)  The format for your next appointment:   In Person  Provider:   Armanda Magic, MD       Signed, Jacolyn Reedy, PA-C  06/14/2020 10:38 AM    [facility] [address] [place], [place], [place]  [address] Phone: [phone]; Fax: [phone]

## 2020-06-13 NOTE — Telephone Encounter (Signed)
   [facility] Pre-operative Risk Assessment    HEARTCARE STAFF: - Please ensure there is not already an duplicate clearance open for this procedure. - Under Visit Info/Reason for Call, type in Other and utilize the format Clearance MM/DD/YY or Clearance TBD. Do not use dashes or single digits. - If request is for dental extraction, please clarify the # of teeth to be extracted.  Request for surgical clearance:  1. What type of surgery is being performed? Right total knee replacement    2. When is this surgery scheduled? 06/30/20   3. What type of clearance is required (medical clearance vs. Pharmacy clearance to hold med vs. Both)? Medical   4. Are there any medications that need to be held prior to surgery and how long? None listed    5. Practice name and name of physician performing surgery?  [facility], Dr. Elsie Saas   6. What is the office phone number? [phone]   7.   What is the office fax number? [phone] Attn: Sherri   8.   Anesthesia type (None, local, MAC, general) ? None listed    Mendel Ryder 06/13/2020, 2:25 PM  _________________________________________________________________   (provider comments below)

## 2020-06-14 ENCOUNTER — Ambulatory Visit: Payer: Medicare Other | Admitting: Physician Assistant

## 2020-06-14 ENCOUNTER — Other Ambulatory Visit: Payer: Self-pay

## 2020-06-14 ENCOUNTER — Encounter: Payer: Self-pay | Admitting: Physician Assistant

## 2020-06-14 VITALS — BP 146/72 | HR 70 | Ht 63.0 in | Wt 208.4 lb

## 2020-06-14 DIAGNOSIS — Z01818 Encounter for other preprocedural examination: Secondary | ICD-10-CM

## 2020-06-14 DIAGNOSIS — R011 Cardiac murmur, unspecified: Secondary | ICD-10-CM | POA: Diagnosis not present

## 2020-06-14 DIAGNOSIS — E785 Hyperlipidemia, unspecified: Secondary | ICD-10-CM

## 2020-06-14 DIAGNOSIS — I5032 Chronic diastolic (congestive) heart failure: Secondary | ICD-10-CM | POA: Diagnosis not present

## 2020-06-14 DIAGNOSIS — I10 Essential (primary) hypertension: Secondary | ICD-10-CM | POA: Diagnosis not present

## 2020-06-14 DIAGNOSIS — R001 Bradycardia, unspecified: Secondary | ICD-10-CM

## 2020-06-14 NOTE — Patient Instructions (Signed)
Medication Instructions:  Your physician recommends that you continue on your current medications as directed. Please refer to the Current Medication list given to you today.  *If you need a refill on your cardiac medications before your next appointment, please call your pharmacy*   Lab Work: None If you have labs (blood work) drawn today and your tests are completely normal, you will receive your results only by: MyChart Message (if you have MyChart) OR A paper copy in the mail If you have any lab test that is abnormal or we need to change your treatment, we will call you to review the results.   Testing/Procedures: Your physician has requested that you have an echocardiogram. Echocardiography is a painless test that uses sound waves to create images of your heart. It provides your doctor with information about the size and shape of your heart and how well your heart's chambers and valves are working. This procedure takes approximately one hour. There are no restrictions for this procedure.   Follow-Up: At [facility], you and your health needs are our priority.  As part of our continuing mission to provide you with exceptional heart care, we have created designated Provider Care Teams.  These Care Teams include your primary Cardiologist (physician) and Advanced Practice Providers (APPs -  Physician Assistants and Nurse Practitioners) who all work together to provide you with the care you need, when you need it.  Your next appointment:   1 year(s)  The format for your next appointment:   In Person  Provider:   Traci Turner, MD {   

## 2020-06-16 DIAGNOSIS — Z23 Encounter for immunization: Secondary | ICD-10-CM | POA: Diagnosis not present

## 2020-06-30 DIAGNOSIS — G8918 Other acute postprocedural pain: Secondary | ICD-10-CM | POA: Diagnosis not present

## 2020-06-30 DIAGNOSIS — M1711 Unilateral primary osteoarthritis, right knee: Secondary | ICD-10-CM | POA: Diagnosis not present

## 2020-07-01 DIAGNOSIS — M1711 Unilateral primary osteoarthritis, right knee: Secondary | ICD-10-CM | POA: Diagnosis not present

## 2020-07-01 DIAGNOSIS — Z96651 Presence of right artificial knee joint: Secondary | ICD-10-CM | POA: Diagnosis not present

## 2020-07-03 DIAGNOSIS — R262 Difficulty in walking, not elsewhere classified: Secondary | ICD-10-CM | POA: Diagnosis not present

## 2020-07-03 DIAGNOSIS — Z4789 Encounter for other orthopedic aftercare: Secondary | ICD-10-CM | POA: Diagnosis not present

## 2020-07-03 DIAGNOSIS — Z96651 Presence of right artificial knee joint: Secondary | ICD-10-CM | POA: Diagnosis not present

## 2020-07-05 DIAGNOSIS — R262 Difficulty in walking, not elsewhere classified: Secondary | ICD-10-CM | POA: Diagnosis not present

## 2020-07-05 DIAGNOSIS — Z96651 Presence of right artificial knee joint: Secondary | ICD-10-CM | POA: Diagnosis not present

## 2020-07-05 DIAGNOSIS — Z4789 Encounter for other orthopedic aftercare: Secondary | ICD-10-CM | POA: Diagnosis not present

## 2020-07-10 DIAGNOSIS — Z4789 Encounter for other orthopedic aftercare: Secondary | ICD-10-CM | POA: Diagnosis not present

## 2020-07-10 DIAGNOSIS — Z96651 Presence of right artificial knee joint: Secondary | ICD-10-CM | POA: Diagnosis not present

## 2020-07-10 DIAGNOSIS — R262 Difficulty in walking, not elsewhere classified: Secondary | ICD-10-CM | POA: Diagnosis not present

## 2020-07-11 ENCOUNTER — Other Ambulatory Visit (HOSPITAL_COMMUNITY): Payer: Self-pay | Admitting: Orthopedic Surgery

## 2020-07-11 ENCOUNTER — Other Ambulatory Visit: Payer: Self-pay

## 2020-07-11 ENCOUNTER — Ambulatory Visit (HOSPITAL_COMMUNITY)
Admission: RE | Admit: 2020-07-11 | Discharge: 2020-07-11 | Disposition: A | Discharge status: Home / Self Care | Payer: Medicare Other | Source: Ambulatory Visit | Attending: Orthopedic Surgery | Admitting: Orthopedic Surgery

## 2020-07-11 DIAGNOSIS — M79661 Pain in right lower leg: Secondary | ICD-10-CM | POA: Diagnosis not present

## 2020-07-11 DIAGNOSIS — M7989 Other specified soft tissue disorders: Secondary | ICD-10-CM | POA: Insufficient documentation

## 2020-07-11 DIAGNOSIS — M1711 Unilateral primary osteoarthritis, right knee: Secondary | ICD-10-CM | POA: Diagnosis not present

## 2020-07-18 DIAGNOSIS — R262 Difficulty in walking, not elsewhere classified: Secondary | ICD-10-CM | POA: Diagnosis not present

## 2020-07-18 DIAGNOSIS — Z4789 Encounter for other orthopedic aftercare: Secondary | ICD-10-CM | POA: Diagnosis not present

## 2020-07-18 DIAGNOSIS — Z96651 Presence of right artificial knee joint: Secondary | ICD-10-CM | POA: Diagnosis not present

## 2020-07-20 DIAGNOSIS — R262 Difficulty in walking, not elsewhere classified: Secondary | ICD-10-CM | POA: Diagnosis not present

## 2020-07-20 DIAGNOSIS — Z96651 Presence of right artificial knee joint: Secondary | ICD-10-CM | POA: Diagnosis not present

## 2020-07-20 DIAGNOSIS — Z4789 Encounter for other orthopedic aftercare: Secondary | ICD-10-CM | POA: Diagnosis not present

## 2020-07-25 DIAGNOSIS — Z4789 Encounter for other orthopedic aftercare: Secondary | ICD-10-CM | POA: Diagnosis not present

## 2020-07-25 DIAGNOSIS — Z96651 Presence of right artificial knee joint: Secondary | ICD-10-CM | POA: Diagnosis not present

## 2020-07-25 DIAGNOSIS — R262 Difficulty in walking, not elsewhere classified: Secondary | ICD-10-CM | POA: Diagnosis not present

## 2020-07-27 DIAGNOSIS — Z96651 Presence of right artificial knee joint: Secondary | ICD-10-CM | POA: Diagnosis not present

## 2020-07-27 DIAGNOSIS — Z4789 Encounter for other orthopedic aftercare: Secondary | ICD-10-CM | POA: Diagnosis not present

## 2020-07-27 DIAGNOSIS — R262 Difficulty in walking, not elsewhere classified: Secondary | ICD-10-CM | POA: Diagnosis not present

## 2020-07-31 DIAGNOSIS — R262 Difficulty in walking, not elsewhere classified: Secondary | ICD-10-CM | POA: Diagnosis not present

## 2020-07-31 DIAGNOSIS — Z4789 Encounter for other orthopedic aftercare: Secondary | ICD-10-CM | POA: Diagnosis not present

## 2020-07-31 DIAGNOSIS — Z96651 Presence of right artificial knee joint: Secondary | ICD-10-CM | POA: Diagnosis not present

## 2020-08-02 DIAGNOSIS — R262 Difficulty in walking, not elsewhere classified: Secondary | ICD-10-CM | POA: Diagnosis not present

## 2020-08-02 DIAGNOSIS — Z4789 Encounter for other orthopedic aftercare: Secondary | ICD-10-CM | POA: Diagnosis not present

## 2020-08-02 DIAGNOSIS — Z96651 Presence of right artificial knee joint: Secondary | ICD-10-CM | POA: Diagnosis not present

## 2020-08-08 DIAGNOSIS — Z96651 Presence of right artificial knee joint: Secondary | ICD-10-CM | POA: Diagnosis not present

## 2020-08-08 DIAGNOSIS — Z4789 Encounter for other orthopedic aftercare: Secondary | ICD-10-CM | POA: Diagnosis not present

## 2020-08-08 DIAGNOSIS — R262 Difficulty in walking, not elsewhere classified: Secondary | ICD-10-CM | POA: Diagnosis not present

## 2020-08-10 DIAGNOSIS — Z4789 Encounter for other orthopedic aftercare: Secondary | ICD-10-CM | POA: Diagnosis not present

## 2020-08-10 DIAGNOSIS — R262 Difficulty in walking, not elsewhere classified: Secondary | ICD-10-CM | POA: Diagnosis not present

## 2020-08-10 DIAGNOSIS — Z96651 Presence of right artificial knee joint: Secondary | ICD-10-CM | POA: Diagnosis not present

## 2020-08-14 DIAGNOSIS — Z4789 Encounter for other orthopedic aftercare: Secondary | ICD-10-CM | POA: Diagnosis not present

## 2020-08-14 DIAGNOSIS — Z96651 Presence of right artificial knee joint: Secondary | ICD-10-CM | POA: Diagnosis not present

## 2020-08-14 DIAGNOSIS — R262 Difficulty in walking, not elsewhere classified: Secondary | ICD-10-CM | POA: Diagnosis not present

## 2020-08-17 DIAGNOSIS — Z4789 Encounter for other orthopedic aftercare: Secondary | ICD-10-CM | POA: Diagnosis not present

## 2020-08-17 DIAGNOSIS — Z96651 Presence of right artificial knee joint: Secondary | ICD-10-CM | POA: Diagnosis not present

## 2020-08-17 DIAGNOSIS — R262 Difficulty in walking, not elsewhere classified: Secondary | ICD-10-CM | POA: Diagnosis not present

## 2020-08-21 DIAGNOSIS — R262 Difficulty in walking, not elsewhere classified: Secondary | ICD-10-CM | POA: Diagnosis not present

## 2020-08-21 DIAGNOSIS — Z96651 Presence of right artificial knee joint: Secondary | ICD-10-CM | POA: Diagnosis not present

## 2020-08-21 DIAGNOSIS — Z4789 Encounter for other orthopedic aftercare: Secondary | ICD-10-CM | POA: Diagnosis not present

## 2020-08-28 DIAGNOSIS — Z4789 Encounter for other orthopedic aftercare: Secondary | ICD-10-CM | POA: Diagnosis not present

## 2020-08-28 DIAGNOSIS — R262 Difficulty in walking, not elsewhere classified: Secondary | ICD-10-CM | POA: Diagnosis not present

## 2020-08-28 DIAGNOSIS — Z96651 Presence of right artificial knee joint: Secondary | ICD-10-CM | POA: Diagnosis not present

## 2020-09-06 DIAGNOSIS — R262 Difficulty in walking, not elsewhere classified: Secondary | ICD-10-CM | POA: Diagnosis not present

## 2020-09-06 DIAGNOSIS — Z4789 Encounter for other orthopedic aftercare: Secondary | ICD-10-CM | POA: Diagnosis not present

## 2020-09-06 DIAGNOSIS — Z96651 Presence of right artificial knee joint: Secondary | ICD-10-CM | POA: Diagnosis not present

## 2020-09-19 ENCOUNTER — Ambulatory Visit (HOSPITAL_COMMUNITY): Payer: Medicare Other | Attending: Cardiovascular Disease

## 2020-09-19 ENCOUNTER — Other Ambulatory Visit: Payer: Self-pay

## 2020-09-19 DIAGNOSIS — R011 Cardiac murmur, unspecified: Secondary | ICD-10-CM

## 2020-09-19 LAB — ECHOCARDIOGRAM COMPLETE
Area-P 1/2: 2.48 cm2
S' Lateral: 3 cm

## 2020-09-20 DIAGNOSIS — Z96651 Presence of right artificial knee joint: Secondary | ICD-10-CM | POA: Diagnosis not present

## 2020-09-20 DIAGNOSIS — Z4789 Encounter for other orthopedic aftercare: Secondary | ICD-10-CM | POA: Diagnosis not present

## 2020-09-20 DIAGNOSIS — R262 Difficulty in walking, not elsewhere classified: Secondary | ICD-10-CM | POA: Diagnosis not present

## 2020-10-30 DIAGNOSIS — H40013 Open angle with borderline findings, low risk, bilateral: Secondary | ICD-10-CM | POA: Diagnosis not present

## 2020-11-06 ENCOUNTER — Other Ambulatory Visit (HOSPITAL_BASED_OUTPATIENT_CLINIC_OR_DEPARTMENT_OTHER): Payer: Self-pay | Admitting: Family Medicine

## 2020-11-06 DIAGNOSIS — Z1231 Encounter for screening mammogram for malignant neoplasm of breast: Secondary | ICD-10-CM

## 2020-12-04 ENCOUNTER — Other Ambulatory Visit: Payer: Self-pay

## 2020-12-04 ENCOUNTER — Ambulatory Visit (HOSPITAL_BASED_OUTPATIENT_CLINIC_OR_DEPARTMENT_OTHER)
Admission: RE | Admit: 2020-12-04 | Discharge: 2020-12-04 | Disposition: A | Discharge status: Home / Self Care | Payer: Medicare Other | Source: Ambulatory Visit | Attending: Family Medicine | Admitting: Family Medicine

## 2020-12-04 ENCOUNTER — Encounter (HOSPITAL_BASED_OUTPATIENT_CLINIC_OR_DEPARTMENT_OTHER): Payer: Self-pay

## 2020-12-04 DIAGNOSIS — Z1231 Encounter for screening mammogram for malignant neoplasm of breast: Secondary | ICD-10-CM | POA: Insufficient documentation

## 2020-12-05 DIAGNOSIS — N63 Unspecified lump in unspecified breast: Secondary | ICD-10-CM | POA: Diagnosis not present

## 2020-12-11 ENCOUNTER — Other Ambulatory Visit: Payer: Self-pay | Admitting: Nurse Practitioner

## 2020-12-11 ENCOUNTER — Other Ambulatory Visit: Payer: Self-pay

## 2020-12-12 DIAGNOSIS — M1711 Unilateral primary osteoarthritis, right knee: Secondary | ICD-10-CM | POA: Diagnosis not present

## 2020-12-15 ENCOUNTER — Other Ambulatory Visit: Payer: Self-pay | Admitting: Nurse Practitioner

## 2020-12-15 DIAGNOSIS — N63 Unspecified lump in unspecified breast: Secondary | ICD-10-CM

## 2020-12-18 ENCOUNTER — Ambulatory Visit
Admission: RE | Admit: 2020-12-18 | Discharge: 2020-12-18 | Disposition: A | Discharge status: Home / Self Care | Payer: Medicare Other | Source: Ambulatory Visit | Attending: Nurse Practitioner | Admitting: Nurse Practitioner

## 2020-12-18 ENCOUNTER — Other Ambulatory Visit: Payer: Self-pay | Admitting: Nurse Practitioner

## 2020-12-18 ENCOUNTER — Other Ambulatory Visit: Payer: Self-pay

## 2020-12-18 DIAGNOSIS — R922 Inconclusive mammogram: Secondary | ICD-10-CM | POA: Diagnosis not present

## 2020-12-18 DIAGNOSIS — N644 Mastodynia: Secondary | ICD-10-CM | POA: Diagnosis not present

## 2020-12-18 DIAGNOSIS — N63 Unspecified lump in unspecified breast: Secondary | ICD-10-CM

## 2021-03-08 DIAGNOSIS — M1712 Unilateral primary osteoarthritis, left knee: Secondary | ICD-10-CM | POA: Diagnosis not present

## 2021-04-09 DIAGNOSIS — S83242A Other tear of medial meniscus, current injury, left knee, initial encounter: Secondary | ICD-10-CM | POA: Diagnosis not present

## 2021-04-13 DIAGNOSIS — M25562 Pain in left knee: Secondary | ICD-10-CM | POA: Diagnosis not present

## 2021-04-18 ENCOUNTER — Telehealth: Payer: Self-pay

## 2021-04-18 NOTE — Telephone Encounter (Signed)
   [facility] Pre-operative Risk Assessment    Patient Name: [name]  DOB: [birth year] MRN: [id number]  Request for surgical clearance:  What type of surgery is being performed? Left Total knee Replacement  When is this surgery scheduled? 05/16/2021  What type of clearance is required (medical clearance vs. Pharmacy clearance to hold med vs. Both)? Medical  Are there any medications that need to be held prior to surgery and how long? None list but pt takes Aspirin  Practice name and name of physician performing surgery? [facility] - Elsie Saas, [name]  What is the office phone number? [phone] Jill (Sherri)   7.   What is the office fax number? [place]  8.   Anesthesia type (None, local, MAC, general) ? Not listed   [name] 04/18/2021, 4:53 PM  _________________________________________________________________   (provider comments below)

## 2021-04-19 NOTE — Telephone Encounter (Signed)
   Patient Name: [name]  DOB: 09-19-1940 MRN: [id number]  Primary Cardiologist: Armanda Magic, MD  Chart revisited as part of pre-operative protocol coverage. Patient returned call. I reached back out on update on how she is doing. The patient affirms she has been doing well without any new cardiac symptoms. RCRI 0.9% indicating low CV risk of complications. No new exertional symptoms or concerning cardiac limitations. Therefore, based on ACC/AHA guidelines, the patient would be at acceptable risk for the planned procedure without further cardiovascular testing. The patient was advised that if she develops new symptoms prior to surgery to contact our office to arrange for a follow-up visit, and she verbalized understanding.  If the patient is required to hold ASA for her procedure, she presently does not have any cardiac conditions that would prohibit this.  Will route this bundled recommendation to requesting provider via Epic fax function. Please call with questions.  Laurann Montana, PA-C 04/19/2021, 12:39 PM

## 2021-04-19 NOTE — Telephone Encounter (Signed)
Pt is returning call to [facility] for Pre Op

## 2021-04-19 NOTE — Telephone Encounter (Signed)
   Name: [name]  DOB: 06-Apr-1941  MRN: [id number]   Primary Cardiologist: Armanda Magic, MD  Chart reviewed as part of pre-operative protocol coverage. Patient was contacted 04/19/2021 in reference to pre-operative risk assessment for pending surgery as outlined below.  [name] was last seen on 06/2020 by Jacolyn Reedy for pre-op clearance at that time, felt to be doing well. She has a history outlined to include chronic diastolic CHF, normal LVF, normal coronary arteries by cath 2010, low risk stress test 2016, HTN and dyslipidemia. 2D echo 09/2020 EF 60-65%, grade 1 DD, otherwise normal.  Called pt to assess sx but got voivcemail. LMTCB.  If the patient is required to hold ASA for her procedure, she presently does not have any cardiac conditions that would prohibit this.   Laurann Montana, PA-C 04/19/2021, 11:59 AM

## 2021-04-24 DIAGNOSIS — Z01812 Encounter for preprocedural laboratory examination: Secondary | ICD-10-CM | POA: Diagnosis not present

## 2021-04-24 DIAGNOSIS — R791 Abnormal coagulation profile: Secondary | ICD-10-CM | POA: Diagnosis not present

## 2021-04-24 DIAGNOSIS — M1712 Unilateral primary osteoarthritis, left knee: Secondary | ICD-10-CM | POA: Diagnosis not present

## 2021-04-24 DIAGNOSIS — M25562 Pain in left knee: Secondary | ICD-10-CM | POA: Diagnosis not present

## 2021-04-26 DIAGNOSIS — Z01818 Encounter for other preprocedural examination: Secondary | ICD-10-CM | POA: Diagnosis not present

## 2021-05-16 DIAGNOSIS — G8918 Other acute postprocedural pain: Secondary | ICD-10-CM | POA: Diagnosis not present

## 2021-05-16 DIAGNOSIS — M1712 Unilateral primary osteoarthritis, left knee: Secondary | ICD-10-CM | POA: Diagnosis not present

## 2021-05-18 DIAGNOSIS — M25662 Stiffness of left knee, not elsewhere classified: Secondary | ICD-10-CM | POA: Diagnosis not present

## 2021-05-18 DIAGNOSIS — M1712 Unilateral primary osteoarthritis, left knee: Secondary | ICD-10-CM | POA: Diagnosis not present

## 2021-05-18 DIAGNOSIS — Z4789 Encounter for other orthopedic aftercare: Secondary | ICD-10-CM | POA: Diagnosis not present

## 2021-05-18 DIAGNOSIS — R531 Weakness: Secondary | ICD-10-CM | POA: Diagnosis not present

## 2021-05-18 DIAGNOSIS — Z96652 Presence of left artificial knee joint: Secondary | ICD-10-CM | POA: Diagnosis not present

## 2021-05-21 DIAGNOSIS — Z4789 Encounter for other orthopedic aftercare: Secondary | ICD-10-CM | POA: Diagnosis not present

## 2021-05-21 DIAGNOSIS — M25662 Stiffness of left knee, not elsewhere classified: Secondary | ICD-10-CM | POA: Diagnosis not present

## 2021-05-21 DIAGNOSIS — Z96652 Presence of left artificial knee joint: Secondary | ICD-10-CM | POA: Diagnosis not present

## 2021-05-21 DIAGNOSIS — R531 Weakness: Secondary | ICD-10-CM | POA: Diagnosis not present

## 2021-05-23 DIAGNOSIS — Z4789 Encounter for other orthopedic aftercare: Secondary | ICD-10-CM | POA: Diagnosis not present

## 2021-05-23 DIAGNOSIS — Z96652 Presence of left artificial knee joint: Secondary | ICD-10-CM | POA: Diagnosis not present

## 2021-05-23 DIAGNOSIS — R531 Weakness: Secondary | ICD-10-CM | POA: Diagnosis not present

## 2021-05-23 DIAGNOSIS — M25662 Stiffness of left knee, not elsewhere classified: Secondary | ICD-10-CM | POA: Diagnosis not present

## 2021-05-24 DIAGNOSIS — M1712 Unilateral primary osteoarthritis, left knee: Secondary | ICD-10-CM | POA: Diagnosis not present

## 2021-05-25 DIAGNOSIS — R531 Weakness: Secondary | ICD-10-CM | POA: Diagnosis not present

## 2021-05-25 DIAGNOSIS — M25662 Stiffness of left knee, not elsewhere classified: Secondary | ICD-10-CM | POA: Diagnosis not present

## 2021-05-25 DIAGNOSIS — Z4789 Encounter for other orthopedic aftercare: Secondary | ICD-10-CM | POA: Diagnosis not present

## 2021-05-25 DIAGNOSIS — Z96652 Presence of left artificial knee joint: Secondary | ICD-10-CM | POA: Diagnosis not present

## 2021-05-29 DIAGNOSIS — R531 Weakness: Secondary | ICD-10-CM | POA: Diagnosis not present

## 2021-05-29 DIAGNOSIS — Z4789 Encounter for other orthopedic aftercare: Secondary | ICD-10-CM | POA: Diagnosis not present

## 2021-05-29 DIAGNOSIS — M25662 Stiffness of left knee, not elsewhere classified: Secondary | ICD-10-CM | POA: Diagnosis not present

## 2021-05-29 DIAGNOSIS — Z96652 Presence of left artificial knee joint: Secondary | ICD-10-CM | POA: Diagnosis not present

## 2021-05-31 DIAGNOSIS — M25662 Stiffness of left knee, not elsewhere classified: Secondary | ICD-10-CM | POA: Diagnosis not present

## 2021-05-31 DIAGNOSIS — Z96652 Presence of left artificial knee joint: Secondary | ICD-10-CM | POA: Diagnosis not present

## 2021-05-31 DIAGNOSIS — R531 Weakness: Secondary | ICD-10-CM | POA: Diagnosis not present

## 2021-05-31 DIAGNOSIS — Z4789 Encounter for other orthopedic aftercare: Secondary | ICD-10-CM | POA: Diagnosis not present

## 2021-06-05 DIAGNOSIS — R531 Weakness: Secondary | ICD-10-CM | POA: Diagnosis not present

## 2021-06-05 DIAGNOSIS — Z96652 Presence of left artificial knee joint: Secondary | ICD-10-CM | POA: Diagnosis not present

## 2021-06-05 DIAGNOSIS — M25662 Stiffness of left knee, not elsewhere classified: Secondary | ICD-10-CM | POA: Diagnosis not present

## 2021-06-05 DIAGNOSIS — Z4789 Encounter for other orthopedic aftercare: Secondary | ICD-10-CM | POA: Diagnosis not present

## 2021-06-07 DIAGNOSIS — Z96652 Presence of left artificial knee joint: Secondary | ICD-10-CM | POA: Diagnosis not present

## 2021-06-07 DIAGNOSIS — Z4789 Encounter for other orthopedic aftercare: Secondary | ICD-10-CM | POA: Diagnosis not present

## 2021-06-07 DIAGNOSIS — M25662 Stiffness of left knee, not elsewhere classified: Secondary | ICD-10-CM | POA: Diagnosis not present

## 2021-06-07 DIAGNOSIS — R531 Weakness: Secondary | ICD-10-CM | POA: Diagnosis not present

## 2021-06-12 DIAGNOSIS — Z4789 Encounter for other orthopedic aftercare: Secondary | ICD-10-CM | POA: Diagnosis not present

## 2021-06-12 DIAGNOSIS — Z96652 Presence of left artificial knee joint: Secondary | ICD-10-CM | POA: Diagnosis not present

## 2021-06-12 DIAGNOSIS — M25662 Stiffness of left knee, not elsewhere classified: Secondary | ICD-10-CM | POA: Diagnosis not present

## 2021-06-12 DIAGNOSIS — R531 Weakness: Secondary | ICD-10-CM | POA: Diagnosis not present

## 2021-06-14 DIAGNOSIS — M25662 Stiffness of left knee, not elsewhere classified: Secondary | ICD-10-CM | POA: Diagnosis not present

## 2021-06-14 DIAGNOSIS — R531 Weakness: Secondary | ICD-10-CM | POA: Diagnosis not present

## 2021-06-14 DIAGNOSIS — Z4789 Encounter for other orthopedic aftercare: Secondary | ICD-10-CM | POA: Diagnosis not present

## 2021-06-14 DIAGNOSIS — Z96652 Presence of left artificial knee joint: Secondary | ICD-10-CM | POA: Diagnosis not present

## 2021-06-18 DIAGNOSIS — M25662 Stiffness of left knee, not elsewhere classified: Secondary | ICD-10-CM | POA: Diagnosis not present

## 2021-06-18 DIAGNOSIS — Z4789 Encounter for other orthopedic aftercare: Secondary | ICD-10-CM | POA: Diagnosis not present

## 2021-06-18 DIAGNOSIS — Z96652 Presence of left artificial knee joint: Secondary | ICD-10-CM | POA: Diagnosis not present

## 2021-06-18 DIAGNOSIS — R531 Weakness: Secondary | ICD-10-CM | POA: Diagnosis not present

## 2021-06-19 ENCOUNTER — Ambulatory Visit (HOSPITAL_COMMUNITY)
Admission: RE | Admit: 2021-06-19 | Discharge: 2021-06-19 | Disposition: A | Discharge status: Home / Self Care | Payer: Medicare Other | Source: Ambulatory Visit | Attending: Cardiology | Admitting: Cardiology

## 2021-06-19 ENCOUNTER — Other Ambulatory Visit: Payer: Self-pay

## 2021-06-19 ENCOUNTER — Other Ambulatory Visit (HOSPITAL_COMMUNITY): Payer: Self-pay | Admitting: Orthopedic Surgery

## 2021-06-19 DIAGNOSIS — M7989 Other specified soft tissue disorders: Secondary | ICD-10-CM | POA: Insufficient documentation

## 2021-06-19 DIAGNOSIS — M79605 Pain in left leg: Secondary | ICD-10-CM | POA: Diagnosis not present

## 2021-06-21 DIAGNOSIS — M25662 Stiffness of left knee, not elsewhere classified: Secondary | ICD-10-CM | POA: Diagnosis not present

## 2021-06-21 DIAGNOSIS — Z4789 Encounter for other orthopedic aftercare: Secondary | ICD-10-CM | POA: Diagnosis not present

## 2021-06-21 DIAGNOSIS — Z96652 Presence of left artificial knee joint: Secondary | ICD-10-CM | POA: Diagnosis not present

## 2021-06-21 DIAGNOSIS — R531 Weakness: Secondary | ICD-10-CM | POA: Diagnosis not present

## 2021-07-02 DIAGNOSIS — U071 COVID-19: Secondary | ICD-10-CM | POA: Diagnosis not present

## 2021-07-10 DIAGNOSIS — M25662 Stiffness of left knee, not elsewhere classified: Secondary | ICD-10-CM | POA: Diagnosis not present

## 2021-07-10 DIAGNOSIS — Z4789 Encounter for other orthopedic aftercare: Secondary | ICD-10-CM | POA: Diagnosis not present

## 2021-07-10 DIAGNOSIS — Z96652 Presence of left artificial knee joint: Secondary | ICD-10-CM | POA: Diagnosis not present

## 2021-07-10 DIAGNOSIS — R531 Weakness: Secondary | ICD-10-CM | POA: Diagnosis not present

## 2021-07-12 DIAGNOSIS — Z96652 Presence of left artificial knee joint: Secondary | ICD-10-CM | POA: Diagnosis not present

## 2021-07-12 DIAGNOSIS — Z4789 Encounter for other orthopedic aftercare: Secondary | ICD-10-CM | POA: Diagnosis not present

## 2021-07-12 DIAGNOSIS — R531 Weakness: Secondary | ICD-10-CM | POA: Diagnosis not present

## 2021-07-12 DIAGNOSIS — M25662 Stiffness of left knee, not elsewhere classified: Secondary | ICD-10-CM | POA: Diagnosis not present

## 2021-07-13 DIAGNOSIS — Z96652 Presence of left artificial knee joint: Secondary | ICD-10-CM | POA: Diagnosis not present

## 2021-07-13 DIAGNOSIS — R531 Weakness: Secondary | ICD-10-CM | POA: Diagnosis not present

## 2021-07-13 DIAGNOSIS — Z4789 Encounter for other orthopedic aftercare: Secondary | ICD-10-CM | POA: Diagnosis not present

## 2021-07-13 DIAGNOSIS — M25662 Stiffness of left knee, not elsewhere classified: Secondary | ICD-10-CM | POA: Diagnosis not present

## 2021-07-16 DIAGNOSIS — Z4789 Encounter for other orthopedic aftercare: Secondary | ICD-10-CM | POA: Diagnosis not present

## 2021-07-16 DIAGNOSIS — R531 Weakness: Secondary | ICD-10-CM | POA: Diagnosis not present

## 2021-07-16 DIAGNOSIS — M25662 Stiffness of left knee, not elsewhere classified: Secondary | ICD-10-CM | POA: Diagnosis not present

## 2021-07-16 DIAGNOSIS — Z96652 Presence of left artificial knee joint: Secondary | ICD-10-CM | POA: Diagnosis not present

## 2021-07-25 DIAGNOSIS — Z0000 Encounter for general adult medical examination without abnormal findings: Secondary | ICD-10-CM | POA: Diagnosis not present

## 2021-07-25 DIAGNOSIS — I5032 Chronic diastolic (congestive) heart failure: Secondary | ICD-10-CM | POA: Diagnosis not present

## 2021-07-25 DIAGNOSIS — I10 Essential (primary) hypertension: Secondary | ICD-10-CM | POA: Diagnosis not present

## 2021-07-25 DIAGNOSIS — Z87898 Personal history of other specified conditions: Secondary | ICD-10-CM | POA: Diagnosis not present

## 2021-07-25 DIAGNOSIS — E782 Mixed hyperlipidemia: Secondary | ICD-10-CM | POA: Diagnosis not present

## 2021-07-25 DIAGNOSIS — Z23 Encounter for immunization: Secondary | ICD-10-CM | POA: Diagnosis not present

## 2021-07-30 DIAGNOSIS — Z1211 Encounter for screening for malignant neoplasm of colon: Secondary | ICD-10-CM | POA: Diagnosis not present

## 2021-08-01 ENCOUNTER — Other Ambulatory Visit: Payer: Self-pay | Admitting: Family Medicine

## 2021-08-01 DIAGNOSIS — Z13820 Encounter for screening for osteoporosis: Secondary | ICD-10-CM

## 2021-08-06 ENCOUNTER — Other Ambulatory Visit: Payer: Self-pay

## 2021-08-06 ENCOUNTER — Encounter: Payer: Self-pay | Admitting: Cardiology

## 2021-08-06 ENCOUNTER — Ambulatory Visit: Payer: Medicare Other | Admitting: Cardiology

## 2021-08-06 VITALS — BP 134/76 | HR 63 | Ht 63.0 in | Wt 200.4 lb

## 2021-08-06 DIAGNOSIS — I10 Essential (primary) hypertension: Secondary | ICD-10-CM | POA: Diagnosis not present

## 2021-08-06 DIAGNOSIS — I5032 Chronic diastolic (congestive) heart failure: Secondary | ICD-10-CM | POA: Diagnosis not present

## 2021-08-06 DIAGNOSIS — R001 Bradycardia, unspecified: Secondary | ICD-10-CM | POA: Diagnosis not present

## 2021-08-06 DIAGNOSIS — E7800 Pure hypercholesterolemia, unspecified: Secondary | ICD-10-CM | POA: Diagnosis not present

## 2021-08-06 NOTE — Progress Notes (Signed)
Cardiology Office Note:    Date:  08/06/2021   ID:  [name], DOB Oct 11, 1940, MRN [id number]  PCP:  Joycelyn Rua, MD  Cardiologist:  Armanda Magic, MD    Referring MD: Joycelyn Rua, MD   Chief Complaint  Patient presents with   Congestive Heart Failure   Hypertension   Hyperlipidemia    History of Present Illness:    [name] is a 80 y.o. female with a hx of chronic diastolic CHF, normal LVF, normal coronary arteries by cath 2010, HTN and dyslipidemia.  She is here today for followup and is doing well.  She denies any chest pain or pressure, SOB, DOE, PND, orthopnea, dizziness, palpitations or syncope. She has had some mild LLE edema since her left knee replacement. She is compliant with her meds and is tolerating meds with no SE.     Past Medical History:  Diagnosis Date   Bradycardia 02/28/2017   Chronic diastolic CHF (congestive heart failure) (HCC)    Constipation    Diastolic dysfunction    Essential hypertension 05/06/2017   Essential hypertension, benign 01/05/2014   Gastric reflux    GERD (gastroesophageal reflux disease)    Hyperlipemia    dyslipidemia   Hypertension    Knee pain    bilateral   Obesity    Shoulder pain    right shoulder   SOB (shortness of breath) on exertion    Swelling    feet and legs   Vitamin D deficiency 05/06/2017    Past Surgical History:  Procedure Laterality Date   ABDOMINAL HYSTERECTOMY     APPENDECTOMY  1977   CARDIAC CATHETERIZATION  02/2009   no evidence of CAD/ Elevated LVEDP at the time ofcath c/w diastolic dysfunction   KNEE SURGERY  1986   NECK SURGERY     OOPHORECTOMY  1972   SHOULDER ARTHROSCOPY      Current Medications: Current Meds  Medication Sig   acetaminophen (TYLENOL) 650 MG CR tablet Take 1,300 mg by mouth every 8 (eight) hours as needed for pain.   aspirin EC 81 MG tablet Take 81 mg by mouth at bedtime.   furosemide (LASIX) 20 MG tablet TAKE 1 TABLET BY MOUTH DAILY   gabapentin  (NEURONTIN) 300 MG capsule Take 300 mg by mouth at bedtime.   lisinopril (ZESTRIL) 10 MG tablet Take 1 tablet (10 mg total) by mouth daily. Please make overdue appt with Dr. Mayford Knife before anymore refills. 2nd attempt   lovastatin (MEVACOR) 40 MG tablet Take 0.5 tablets (20 mg total) by mouth at bedtime. Please make overdue appt with Dr. Mayford Knife before anymore refills. 2nd attempt (Patient taking differently: Take 40 mg by mouth at bedtime. Please make overdue appt with Dr. Mayford Knife before anymore refills. 2nd attempt)   Multiple Vitamin (MULTIVITAMIN WITH MINERALS) TABS tablet Take 1 tablet by mouth daily.   Omega-3 Fatty Acids (FISH OIL) 1000 MG CAPS Take 1,000 mg by mouth 2 (two) times daily.   omeprazole (PRILOSEC) 20 MG capsule Take 20 mg by mouth daily.   zolpidem (AMBIEN) 5 MG tablet Take 5 mg by mouth at bedtime.     Allergies:   Morphine and related, Morphine, and Morphine sulfate   Social History   Socioeconomic History   Marital status: Married    Spouse name: [name]   Number of children: Not on file   Years of education: Not on file   Highest education level: Not on file  Occupational History   Occupation:  retired - housekeeper  Tobacco Use   Smoking status: Never   Smokeless tobacco: Never  Vaping Use   Vaping Use: Never used  Substance and Sexual Activity   Alcohol use: No   Drug use: No   Sexual activity: Not on file  Other Topics Concern   Not on file  Social History Narrative   Not on file   Social Determinants of Health   Financial Resource Strain: Not on file  Food Insecurity: Not on file  Transportation Needs: Not on file  Physical Activity: Not on file  Stress: Not on file  Social Connections: Not on file     Family History: The patient's family history includes CAD in her brother, brother, brother, father, and sister; Diabetes in her mother; Heart attack in her father; Heart disease in her father and mother; Heart failure in her mother and sister;  Hyperlipidemia in her father and mother; Hypertension in her father and mother; Other in her brother and sister; Pancreatic cancer in her brother; Stroke in her mother; Sudden death in her father.  ROS:   Please see the history of present illness.    ROS  All other systems reviewed and negative.   EKGs/Labs/Other Studies Reviewed:    The following studies were reviewed today: none  EKG:  EKG is ordered today and demonstrates NSR with nonspecific T wave abnormality  Recent Labs: No results found for requested labs within last 8760 hours.   Recent Lipid Panel    Component Value Date/Time   CHOL 171 07/19/2019 1603   TRIG 127 07/19/2019 1603   HDL 47 07/19/2019 1603   CHOLHDL 3.6 07/19/2019 1603   CHOLHDL 4.5 03/18/2016 1000   VLDL 25 03/18/2016 1000   LDLCALC 101 (H) 07/19/2019 1603    Physical Exam:    VS:  BP 134/76   Pulse 63   Ht 5\' 3"  (1.6 m)   Wt 200 lb 6.4 oz (90.9 kg)   SpO2 95%   BMI 35.50 kg/m     Wt Readings from Last 3 Encounters:  08/06/21 200 lb 6.4 oz (90.9 kg)  06/14/20 208 lb 6.4 oz (94.5 kg)  12/22/19 200 lb (90.7 kg)     GEN: Well nourished, well developed in no acute distress HEENT: Normal NECK: No JVD; No carotid bruits LYMPHATICS: No lymphadenopathy CARDIAC:RRR, no murmurs, rubs, gallops RESPIRATORY:  Clear to auscultation without rales, wheezing or rhonchi  ABDOMEN: Soft, non-tender, non-distended MUSCULOSKELETAL:  Trace LLE edema No deformity  SKIN: Warm and dry NEUROLOGIC:  Alert and oriented x 3 PSYCHIATRIC:  Normal affect   ASSESSMENT:    1. Chronic diastolic CHF (congestive heart failure) (HCC)   2. Essential hypertension, benign   3. Bradycardia   4. Pure hypercholesterolemia    PLAN:    In order of problems listed above:  1.  Chronic diastolic CHF -she denies any SOB or LE edema -her weight is stable -continue lasix 20mg  daily -check BMET today  2.  HTN -BP is adequately controlled on exam today -Continue  prescription drug management with lisinopril 10 mg daily with as needed refills -I have personally reviewed and interpreted outside labs performed by patient's PCP which showed serum creatinine 0.64 and potassium 4.1 on 07/25/2021  3.  Bradycardia -bradycardia resolved  4.  HLD -LDL goal is < 100 -I have personally reviewed and interpreted outside labs performed by patient's PCP which showed LDL 90, HDL 35, ALT 26 on and 04/24/2021 -continue prescription drug management with  lovastatin 20 mg daily with as needed refills     Medication Adjustments/Labs and Tests Ordered: Current medicines are reviewed at length with the patient today.  Concerns regarding medicines are outlined above.  Orders Placed This Encounter  Procedures   EKG 12-Lead    No orders of the defined types were placed in this encounter.   Signed, Armanda Magic, MD  08/06/2021 9:21 AM    [facility]

## 2021-08-06 NOTE — Patient Instructions (Signed)

## 2021-09-05 DIAGNOSIS — L570 Actinic keratosis: Secondary | ICD-10-CM | POA: Diagnosis not present

## 2021-09-05 DIAGNOSIS — L400 Psoriasis vulgaris: Secondary | ICD-10-CM | POA: Diagnosis not present

## 2022-01-02 DIAGNOSIS — J320 Chronic maxillary sinusitis: Secondary | ICD-10-CM | POA: Diagnosis not present

## 2022-01-02 DIAGNOSIS — H401190 Primary open-angle glaucoma, unspecified eye, stage unspecified: Secondary | ICD-10-CM | POA: Diagnosis not present

## 2022-01-18 DIAGNOSIS — G500 Trigeminal neuralgia: Secondary | ICD-10-CM | POA: Diagnosis not present

## 2022-02-06 DIAGNOSIS — R519 Headache, unspecified: Secondary | ICD-10-CM | POA: Diagnosis not present

## 2022-02-11 ENCOUNTER — Telehealth (HOSPITAL_BASED_OUTPATIENT_CLINIC_OR_DEPARTMENT_OTHER): Payer: Self-pay

## 2022-02-13 ENCOUNTER — Ambulatory Visit
Admission: RE | Admit: 2022-02-13 | Discharge: 2022-02-13 | Disposition: A | Discharge status: Home / Self Care | Payer: Medicare Other | Source: Ambulatory Visit | Attending: Family Medicine | Admitting: Family Medicine

## 2022-02-13 DIAGNOSIS — M85852 Other specified disorders of bone density and structure, left thigh: Secondary | ICD-10-CM | POA: Diagnosis not present

## 2022-02-13 DIAGNOSIS — Z780 Asymptomatic menopausal state: Secondary | ICD-10-CM | POA: Diagnosis not present

## 2022-02-13 DIAGNOSIS — Z13820 Encounter for screening for osteoporosis: Secondary | ICD-10-CM

## 2022-02-18 ENCOUNTER — Other Ambulatory Visit (HOSPITAL_BASED_OUTPATIENT_CLINIC_OR_DEPARTMENT_OTHER): Payer: Self-pay | Admitting: Family Medicine

## 2022-02-18 DIAGNOSIS — Z1231 Encounter for screening mammogram for malignant neoplasm of breast: Secondary | ICD-10-CM

## 2022-02-25 ENCOUNTER — Ambulatory Visit (HOSPITAL_BASED_OUTPATIENT_CLINIC_OR_DEPARTMENT_OTHER)
Admission: RE | Admit: 2022-02-25 | Discharge: 2022-02-25 | Disposition: A | Discharge status: Home / Self Care | Payer: Medicare Other | Source: Ambulatory Visit | Attending: Family Medicine | Admitting: Family Medicine

## 2022-02-25 ENCOUNTER — Encounter (HOSPITAL_BASED_OUTPATIENT_CLINIC_OR_DEPARTMENT_OTHER): Payer: Self-pay

## 2022-02-25 DIAGNOSIS — Z1231 Encounter for screening mammogram for malignant neoplasm of breast: Secondary | ICD-10-CM | POA: Diagnosis not present

## 2022-02-26 NOTE — Progress Notes (Signed)
Normal mammogram. Follow up in 1 year.

## 2022-03-18 DIAGNOSIS — S46012A Strain of muscle(s) and tendon(s) of the rotator cuff of left shoulder, initial encounter: Secondary | ICD-10-CM | POA: Diagnosis not present

## 2022-03-18 DIAGNOSIS — S52125A Nondisplaced fracture of head of left radius, initial encounter for closed fracture: Secondary | ICD-10-CM | POA: Diagnosis not present

## 2022-03-18 DIAGNOSIS — M1712 Unilateral primary osteoarthritis, left knee: Secondary | ICD-10-CM | POA: Diagnosis not present

## 2022-03-19 ENCOUNTER — Other Ambulatory Visit: Payer: Self-pay | Admitting: Orthopedic Surgery

## 2022-03-19 DIAGNOSIS — S42402A Unspecified fracture of lower end of left humerus, initial encounter for closed fracture: Secondary | ICD-10-CM

## 2022-03-20 ENCOUNTER — Ambulatory Visit
Admission: RE | Admit: 2022-03-20 | Discharge: 2022-03-20 | Disposition: A | Discharge status: Home / Self Care | Payer: Medicare Other | Source: Ambulatory Visit | Attending: Orthopedic Surgery | Admitting: Orthopedic Surgery

## 2022-03-20 DIAGNOSIS — S52122A Displaced fracture of head of left radius, initial encounter for closed fracture: Secondary | ICD-10-CM | POA: Diagnosis not present

## 2022-03-20 DIAGNOSIS — S42402A Unspecified fracture of lower end of left humerus, initial encounter for closed fracture: Secondary | ICD-10-CM

## 2022-03-21 ENCOUNTER — Telehealth: Payer: Self-pay

## 2022-03-21 DIAGNOSIS — M1712 Unilateral primary osteoarthritis, left knee: Secondary | ICD-10-CM | POA: Diagnosis not present

## 2022-03-21 NOTE — Telephone Encounter (Signed)
   Pre-operative Risk Assessment    Patient Name: [name]  DOB: Oct 16, 1940 MRN: [id number]      Request for Surgical Clearance    Procedure:   ORIF Left Elbow  Date of Surgery:  Clearance 04/01/22                                 Surgeon:  Ramond Marrow, MD Surgeon's Group or Practice Name:  [facility] Phone number:  [phone] Fax number:  [phone]   Type of Clearance Requested:   - Medical    Type of Anesthesia:   Choice   Additional requests/questions:   Pt takes ASA  Signed, Darthula Desa   03/21/2022, 4:58 PM

## 2022-03-22 ENCOUNTER — Telehealth: Payer: Self-pay | Admitting: *Deleted

## 2022-03-22 NOTE — Telephone Encounter (Signed)
   Name: STARKEISHA VANWINKLE  DOB: 1940/09/17  MRN: [id number]  Primary Cardiologist: Armanda Magic, MD  Chart reviewed as part of pre-operative protocol coverage. Because of Rily Nickey Carlino's past medical history and time since last visit, she will require a follow-up tele visit in order to better assess preoperative cardiovascular risk.  Pre-op covering staff: - Please schedule appointment and call patient to inform them. If patient already had an upcoming appointment within acceptable timeframe, please add "pre-op clearance" to the appointment notes so provider is aware. - Please contact requesting surgeon's office via preferred method (i.e, phone, fax) to inform them of need for appointment prior to surgery.  As far as her aspirin goes, we would prefer for her to stay on it if possible.  If it is determined too high of bleeding risk may hold ASA x7 days prior to procedure.  Sharlene Dory, PA-C  03/22/2022, 7:46 AM

## 2022-03-22 NOTE — Telephone Encounter (Signed)
Pt agreeable to plan of care  for tele visit 03/27/22 @ 2:40. Med rec and consent are done.

## 2022-03-22 NOTE — Telephone Encounter (Signed)
Pt agreeable to plan of care  for tele visit 03/27/22 @ 2:40. Med rec and consent are done.    Patient Consent for Virtual Visit        [name] has provided verbal consent on 03/22/2022 for a virtual visit (video or telephone).   CONSENT FOR VIRTUAL VISIT FOR:  [name]  By participating in this virtual visit I agree to the following:  I hereby voluntarily request, consent and authorize [facility] and its employed or contracted physicians, physician assistants, nurse practitioners or other licensed health care professionals (the Practitioner), to provide me with telemedicine health care services (the "Services") as deemed necessary by the treating Practitioner. I acknowledge and consent to receive the Services by the Practitioner via telemedicine. I understand that the telemedicine visit will involve communicating with the Practitioner through live audiovisual communication technology and the disclosure of certain medical information by electronic transmission. I acknowledge that I have been given the opportunity to request an in-person assessment or other available alternative prior to the telemedicine visit and am voluntarily participating in the telemedicine visit.  I understand that I have the right to withhold or withdraw my consent to the use of telemedicine in the course of my care at any time, without affecting my right to future care or treatment, and that the Practitioner or I may terminate the telemedicine visit at any time. I understand that I have the right to inspect all information obtained and/or recorded in the course of the telemedicine visit and may receive copies of available information for a reasonable fee.  I understand that some of the potential risks of receiving the Services via telemedicine include:  Delay or interruption in medical evaluation due to technological equipment failure or disruption; Information transmitted may not be sufficient (e.g.  poor resolution of images) to allow for appropriate medical decision making by the Practitioner; and/or  In rare instances, security protocols could fail, causing a breach of personal health information.  Furthermore, I acknowledge that it is my responsibility to provide information about my medical history, conditions and care that is complete and accurate to the best of my ability. I acknowledge that Practitioner's advice, recommendations, and/or decision may be based on factors not within their control, such as incomplete or inaccurate data provided by me or distortions of diagnostic images or specimens that may result from electronic transmissions. I understand that the practice of medicine is not an exact science and that Practitioner makes no warranties or guarantees regarding treatment outcomes. I acknowledge that a copy of this consent can be made available to me via my patient portal [facility] MyChart), or I can request a printed copy by calling the office of [facility].    I understand that my insurance will be billed for this visit.   I have read or had this consent read to me. I understand the contents of this consent, which adequately explains the benefits and risks of the Services being provided via telemedicine.  I have been provided ample opportunity to ask questions regarding this consent and the Services and have had my questions answered to my satisfaction. I give my informed consent for the services to be provided through the use of telemedicine in my medical care

## 2022-03-27 ENCOUNTER — Ambulatory Visit (INDEPENDENT_AMBULATORY_CARE_PROVIDER_SITE_OTHER): Payer: Medicare Other | Admitting: Nurse Practitioner

## 2022-03-27 DIAGNOSIS — Z01810 Encounter for preprocedural cardiovascular examination: Secondary | ICD-10-CM

## 2022-03-27 NOTE — Progress Notes (Signed)
Virtual Visit via Telephone Note   Because of [name]'s co-morbid illnesses, she is at least at moderate risk for complications without adequate follow up.  This format is felt to be most appropriate for this patient at this time.  The patient did not have access to video technology/had technical difficulties with video requiring transitioning to audio format only (telephone).  All issues noted in this document were discussed and addressed.  No physical exam could be performed with this format.  Please refer to the patient's chart for her consent to telehealth for [facility].  Evaluation Performed:  Preoperative cardiovascular risk assessment _____________   Date:  03/27/2022   Patient ID:  [name], DOB May 08, 1941, MRN [id number] Patient Location:  Home Provider location:   Office  Primary Care Provider:  Joycelyn Rua, MD Primary Cardiologist:  Armanda Magic, MD  Chief Complaint / Patient Profile   81 y.o. y/o female with a h/o chronic diastolic heart failure, hypertension, hyperlipidemia, bradycardia, and GERD who is pending ORIF of left elbow on 04/01/2022 with Dr. Ramond Marrow of [facility] and presents today for telephonic preoperative cardiovascular risk assessment.  Past Medical History    Past Medical History:  Diagnosis Date   Bradycardia 02/28/2017   Chronic diastolic CHF (congestive heart failure) (HCC)    Constipation    Diastolic dysfunction    Essential hypertension 05/06/2017   Essential hypertension, benign 01/05/2014   Gastric reflux    GERD (gastroesophageal reflux disease)    Hyperlipemia    dyslipidemia   Hypertension    Knee pain    bilateral   Obesity    Shoulder pain    right shoulder   SOB (shortness of breath) on exertion    Swelling    feet and legs   Vitamin D deficiency 05/06/2017   Past Surgical History:  Procedure Laterality Date   ABDOMINAL HYSTERECTOMY     APPENDECTOMY  1977   CARDIAC  CATHETERIZATION  02/2009   no evidence of CAD/ Elevated LVEDP at the time ofcath c/w diastolic dysfunction   KNEE SURGERY  1986   NECK SURGERY     OOPHORECTOMY  1972   SHOULDER ARTHROSCOPY      Allergies  Allergies  Allergen Reactions   Morphine And Related Nausea And Vomiting   Morphine    Morphine Sulfate Other (See Comments)    History of Present Illness    [name] is a 81 y.o. female who presents via audio/video conferencing for a telehealth visit today.  Pt was last seen in cardiology clinic on 08/06/2021 by Dr. Mayford Knife.  At that time [name] was doing well.  The patient is now pending procedure as outlined above. Since her last visit, she has done well from a cardiac standpoint. She denies chest pain, palpitations, dyspnea, pnd, orthopnea, n, v, dizziness, syncope, edema, weight gain, or early satiety. All other systems reviewed and are otherwise negative except as noted above.   Home Medications    Prior to Admission medications   Medication Sig Start Date End Date Taking? Authorizing Provider  acetaminophen (TYLENOL) 650 MG CR tablet Take 1,300 mg by mouth every 8 (eight) hours as needed for pain.    [provider]  aspirin EC 81 MG tablet Take 81 mg by mouth at bedtime.    [provider]  Calcium Carb-Cholecalciferol (CALCIUM 1000 + D PO) Take 1 tablet by mouth daily.    [provider]  furosemide (LASIX)  20 MG tablet TAKE 1 TABLET BY MOUTH DAILY 04/19/19   Quintella Reichert, MD  gabapentin (NEURONTIN) 300 MG capsule Take 300 mg by mouth at bedtime. 05/25/20   [provider]  HYDROcodone-acetaminophen (NORCO/VICODIN) 5-325 MG tablet Take 1-2 tablets by mouth every 6 (six) hours as needed. Patient not taking: Reported on 08/06/2021 12/22/19   Graciella Freer A, PA-C  lisinopril (ZESTRIL) 10 MG tablet Take 1 tablet (10 mg total) by mouth daily. Please make overdue appt with Dr. Mayford Knife before anymore refills. 2nd attempt  06/14/19   Quintella Reichert, MD  lovastatin (MEVACOR) 40 MG tablet Take 0.5 tablets (20 mg total) by mouth at bedtime. Please make overdue appt with Dr. Mayford Knife before anymore refills. 2nd attempt 06/14/19   Quintella Reichert, MD  meloxicam (MOBIC) 7.5 MG tablet Take 7.5 mg by mouth daily.    [provider]  Multiple Vitamin (MULTIVITAMIN WITH MINERALS) TABS tablet Take 1 tablet by mouth daily.    [provider]  Omega-3 Fatty Acids (FISH OIL) 1000 MG CAPS Take 1,000 mg by mouth 2 (two) times daily.    [provider]  omeprazole (PRILOSEC) 20 MG capsule Take 20 mg by mouth daily.    [provider]  Polyvinyl Alcohol-Povidone (REFRESH OP) Place 1 drop into both eyes daily as needed (dry eyes). Patient not taking: Reported on 08/06/2021    [provider]  predniSONE (DELTASONE) 5 MG tablet Take 5 mg by mouth daily. Patient not taking: Reported on 08/06/2021 06/06/20   [provider]  tiZANidine (ZANAFLEX) 2 MG tablet Take 2 mg by mouth at bedtime. Patient not taking: Reported on 08/06/2021 06/06/20   [provider]  zolpidem (AMBIEN) 5 MG tablet Take 5 mg by mouth at bedtime.    [provider]    Physical Exam    Vital Signs:  [name] does not have vital signs available for review today.  Given telephonic nature of communication, physical exam is limited. AAOx3. NAD. Normal affect.  Speech and respirations are unlabored.  Accessory Clinical Findings    None  Assessment & Plan    1.  Preoperative Cardiovascular Risk Assessment:  According to the Revised Cardiac Risk Index (RCRI), her Perioperative Risk of Major Cardiac Event is (%): 0.9. Her Functional Capacity in METs is: 6.61 according to the Duke Activity Status Index (DASI). Therefore, based on ACC/AHA guidelines, patient would be at acceptable risk for the planned procedure without further cardiovascular testing.  Per office protocol, we generally  recommend that the patient continue aspirin throughout the perioperative period.  However, if necessary, patient may hold aspirin for 7 days prior to procedure.  Please resume aspirin as soon as possible postprocedure the discretion of the surgeon.    A copy of this note will be routed to requesting surgeon.  Time:   Today, I have spent 7 minutes with the patient with telehealth technology discussing medical history, symptoms, and management plan.     Joylene Grapes, NP  03/27/2022, 2:52 PM

## 2022-04-01 DIAGNOSIS — S42402A Unspecified fracture of lower end of left humerus, initial encounter for closed fracture: Secondary | ICD-10-CM | POA: Diagnosis not present

## 2022-04-01 DIAGNOSIS — S52122A Displaced fracture of head of left radius, initial encounter for closed fracture: Secondary | ICD-10-CM | POA: Diagnosis not present

## 2022-04-01 DIAGNOSIS — G8918 Other acute postprocedural pain: Secondary | ICD-10-CM | POA: Diagnosis not present

## 2022-04-09 DIAGNOSIS — S42402A Unspecified fracture of lower end of left humerus, initial encounter for closed fracture: Secondary | ICD-10-CM | POA: Diagnosis not present

## 2022-04-10 DIAGNOSIS — R531 Weakness: Secondary | ICD-10-CM | POA: Diagnosis not present

## 2022-04-10 DIAGNOSIS — M25522 Pain in left elbow: Secondary | ICD-10-CM | POA: Diagnosis not present

## 2022-04-10 DIAGNOSIS — Z4789 Encounter for other orthopedic aftercare: Secondary | ICD-10-CM | POA: Diagnosis not present

## 2022-04-12 DIAGNOSIS — M25522 Pain in left elbow: Secondary | ICD-10-CM | POA: Diagnosis not present

## 2022-04-12 DIAGNOSIS — R531 Weakness: Secondary | ICD-10-CM | POA: Diagnosis not present

## 2022-04-12 DIAGNOSIS — Z4789 Encounter for other orthopedic aftercare: Secondary | ICD-10-CM | POA: Diagnosis not present

## 2022-04-15 DIAGNOSIS — Z4789 Encounter for other orthopedic aftercare: Secondary | ICD-10-CM | POA: Diagnosis not present

## 2022-04-15 DIAGNOSIS — M25522 Pain in left elbow: Secondary | ICD-10-CM | POA: Diagnosis not present

## 2022-04-15 DIAGNOSIS — R531 Weakness: Secondary | ICD-10-CM | POA: Diagnosis not present

## 2022-04-17 DIAGNOSIS — R531 Weakness: Secondary | ICD-10-CM | POA: Diagnosis not present

## 2022-04-17 DIAGNOSIS — Z4789 Encounter for other orthopedic aftercare: Secondary | ICD-10-CM | POA: Diagnosis not present

## 2022-04-17 DIAGNOSIS — M25522 Pain in left elbow: Secondary | ICD-10-CM | POA: Diagnosis not present

## 2022-04-23 DIAGNOSIS — S42402D Unspecified fracture of lower end of left humerus, subsequent encounter for fracture with routine healing: Secondary | ICD-10-CM | POA: Diagnosis not present

## 2022-04-24 DIAGNOSIS — Z4789 Encounter for other orthopedic aftercare: Secondary | ICD-10-CM | POA: Diagnosis not present

## 2022-04-24 DIAGNOSIS — M25522 Pain in left elbow: Secondary | ICD-10-CM | POA: Diagnosis not present

## 2022-04-24 DIAGNOSIS — R531 Weakness: Secondary | ICD-10-CM | POA: Diagnosis not present

## 2022-04-26 DIAGNOSIS — Z4789 Encounter for other orthopedic aftercare: Secondary | ICD-10-CM | POA: Diagnosis not present

## 2022-04-26 DIAGNOSIS — M25522 Pain in left elbow: Secondary | ICD-10-CM | POA: Diagnosis not present

## 2022-04-26 DIAGNOSIS — R531 Weakness: Secondary | ICD-10-CM | POA: Diagnosis not present

## 2022-04-30 DIAGNOSIS — R531 Weakness: Secondary | ICD-10-CM | POA: Diagnosis not present

## 2022-04-30 DIAGNOSIS — M25522 Pain in left elbow: Secondary | ICD-10-CM | POA: Diagnosis not present

## 2022-04-30 DIAGNOSIS — Z4789 Encounter for other orthopedic aftercare: Secondary | ICD-10-CM | POA: Diagnosis not present

## 2022-05-02 DIAGNOSIS — R531 Weakness: Secondary | ICD-10-CM | POA: Diagnosis not present

## 2022-05-02 DIAGNOSIS — Z4789 Encounter for other orthopedic aftercare: Secondary | ICD-10-CM | POA: Diagnosis not present

## 2022-05-02 DIAGNOSIS — M25522 Pain in left elbow: Secondary | ICD-10-CM | POA: Diagnosis not present

## 2022-05-07 DIAGNOSIS — M25522 Pain in left elbow: Secondary | ICD-10-CM | POA: Diagnosis not present

## 2022-05-07 DIAGNOSIS — R531 Weakness: Secondary | ICD-10-CM | POA: Diagnosis not present

## 2022-05-07 DIAGNOSIS — Z4789 Encounter for other orthopedic aftercare: Secondary | ICD-10-CM | POA: Diagnosis not present

## 2022-05-08 DIAGNOSIS — H35372 Puckering of macula, left eye: Secondary | ICD-10-CM | POA: Diagnosis not present

## 2022-05-08 DIAGNOSIS — H524 Presbyopia: Secondary | ICD-10-CM | POA: Diagnosis not present

## 2022-05-08 DIAGNOSIS — H40013 Open angle with borderline findings, low risk, bilateral: Secondary | ICD-10-CM | POA: Diagnosis not present

## 2022-05-08 DIAGNOSIS — H04123 Dry eye syndrome of bilateral lacrimal glands: Secondary | ICD-10-CM | POA: Diagnosis not present

## 2022-05-09 DIAGNOSIS — Z4789 Encounter for other orthopedic aftercare: Secondary | ICD-10-CM | POA: Diagnosis not present

## 2022-05-09 DIAGNOSIS — R531 Weakness: Secondary | ICD-10-CM | POA: Diagnosis not present

## 2022-05-09 DIAGNOSIS — M25522 Pain in left elbow: Secondary | ICD-10-CM | POA: Diagnosis not present

## 2022-05-15 DIAGNOSIS — Z4789 Encounter for other orthopedic aftercare: Secondary | ICD-10-CM | POA: Diagnosis not present

## 2022-05-15 DIAGNOSIS — M25522 Pain in left elbow: Secondary | ICD-10-CM | POA: Diagnosis not present

## 2022-05-15 DIAGNOSIS — R531 Weakness: Secondary | ICD-10-CM | POA: Diagnosis not present

## 2022-05-16 ENCOUNTER — Ambulatory Visit: Payer: Medicare Other | Attending: Cardiology | Admitting: Cardiology

## 2022-05-16 ENCOUNTER — Encounter: Payer: Self-pay | Admitting: Cardiology

## 2022-05-16 VITALS — BP 136/80 | HR 66 | Ht 63.0 in | Wt 205.8 lb

## 2022-05-16 DIAGNOSIS — I10 Essential (primary) hypertension: Secondary | ICD-10-CM

## 2022-05-16 DIAGNOSIS — E7800 Pure hypercholesterolemia, unspecified: Secondary | ICD-10-CM

## 2022-05-16 DIAGNOSIS — R001 Bradycardia, unspecified: Secondary | ICD-10-CM

## 2022-05-16 DIAGNOSIS — I5032 Chronic diastolic (congestive) heart failure: Secondary | ICD-10-CM

## 2022-05-16 NOTE — Patient Instructions (Signed)
Medication Instructions:  Your physician recommends that you continue on your current medications as directed. Please refer to the Current Medication list given to you today.  *If you need a refill on your cardiac medications before your next appointment, please call your pharmacy*  Lab Work: TODAY: CMET and FLP If you have labs (blood work) drawn today and your tests are completely normal, you will receive your results only by: MyChart Message (if you have MyChart) OR A paper copy in the mail If you have any lab test that is abnormal or we need to change your treatment, we will call you to review the results.  Follow-Up: At [facility], you and your health needs are our priority.  As part of our continuing mission to provide you with exceptional heart care, we have created designated Provider Care Teams.  These Care Teams include your primary Cardiologist (physician) and Advanced Practice Providers (APPs -  Physician Assistants and Nurse Practitioners) who all work together to provide you with the care you need, when you need it.  Your next appointment:   1 year(s)  The format for your next appointment:   In Person  Provider:   Armanda Magic, MD     Important Information About Sugar

## 2022-05-16 NOTE — Addendum Note (Signed)
Addended by: Theresia Majors on: 05/16/2022 03:29 PM   Modules accepted: Orders

## 2022-05-16 NOTE — Progress Notes (Signed)
Cardiology Office Note:    Date:  05/16/2022   ID:  [name], DOB 1941-07-20, MRN [id number]  PCP:  Koren Shiver, DO  Cardiologist:  Armanda Magic, MD    Referring MD: Joycelyn Rua, MD   Chief Complaint  Patient presents with   Congestive Heart Failure   Hypertension   Hyperlipidemia    History of Present Illness:    [name] is a 81 y.o. female with a hx of chronic diastolic CHF, normal LVF, normal coronary arteries by cath 2010, HTN and dyslipidemia.  She is here today for followup and is doing well.  She denies any chest pain or pressure, SOB, DOE, PND, orthopnea, palpitations or syncope. She thinks that her LE edema has increased mainly in her left leg where she had the knee replacement. If she elevates the leg at night it is gone in the am.  She occasionally has some swimmy headedness if she is working out in the heat and bends over and comes back up too fast. She is compliant with her meds and is tolerating meds with no SE.    Past Medical History:  Diagnosis Date   Bradycardia 02/28/2017   Chronic diastolic CHF (congestive heart failure) (HCC)    Constipation    Diastolic dysfunction    Essential hypertension 05/06/2017   Essential hypertension, benign 01/05/2014   Gastric reflux    GERD (gastroesophageal reflux disease)    Hyperlipemia    dyslipidemia   Hypertension    Knee pain    bilateral   Obesity    Shoulder pain    right shoulder   SOB (shortness of breath) on exertion    Swelling    feet and legs   Vitamin D deficiency 05/06/2017    Past Surgical History:  Procedure Laterality Date   ABDOMINAL HYSTERECTOMY     APPENDECTOMY  1977   CARDIAC CATHETERIZATION  02/2009   no evidence of CAD/ Elevated LVEDP at the time ofcath c/w diastolic dysfunction   KNEE SURGERY  1986   NECK SURGERY     OOPHORECTOMY  1972   SHOULDER ARTHROSCOPY      Current Medications: Current Meds  Medication Sig   acetaminophen (TYLENOL) 650 MG CR tablet  Take 1,300 mg by mouth every 8 (eight) hours as needed for pain.   aspirin EC 81 MG tablet Take 81 mg by mouth at bedtime.   Calcium Carb-Cholecalciferol (CALCIUM 1000 + D PO) Take 1 tablet by mouth daily.   furosemide (LASIX) 20 MG tablet TAKE 1 TABLET BY MOUTH DAILY   gabapentin (NEURONTIN) 300 MG capsule Take 300 mg by mouth at bedtime.   lisinopril (ZESTRIL) 10 MG tablet Take 1 tablet (10 mg total) by mouth daily. Please make overdue appt with Dr. Mayford Knife before anymore refills. 2nd attempt   lovastatin (MEVACOR) 40 MG tablet Take 0.5 tablets (20 mg total) by mouth at bedtime. Please make overdue appt with Dr. Mayford Knife before anymore refills. 2nd attempt   meloxicam (MOBIC) 7.5 MG tablet Take 7.5 mg by mouth daily.   Multiple Vitamin (MULTIVITAMIN WITH MINERALS) TABS tablet Take 1 tablet by mouth daily.   Omega-3 Fatty Acids (FISH OIL) 1000 MG CAPS Take 1,000 mg by mouth 2 (two) times daily.   omeprazole (PRILOSEC) 20 MG capsule Take 20 mg by mouth daily.   zolpidem (AMBIEN) 5 MG tablet Take 5 mg by mouth at bedtime.     Allergies:   Morphine and related, Morphine, and Morphine sulfate  Social History   Socioeconomic History   Marital status: Married    Spouse name: [name]   Number of children: Not on file   Years of education: Not on file   Highest education level: Not on file  Occupational History   Occupation: retired - housekeeper  Tobacco Use   Smoking status: Never   Smokeless tobacco: Never  Vaping Use   Vaping Use: Never used  Substance and Sexual Activity   Alcohol use: No   Drug use: No   Sexual activity: Not on file  Other Topics Concern   Not on file  Social History Narrative   Not on file   Social Determinants of Health   Financial Resource Strain: Not on file  Food Insecurity: Not on file  Transportation Needs: Not on file  Physical Activity: Not on file  Stress: Not on file  Social Connections: Not on file     Family History: The patient's family  history includes CAD in her brother, brother, brother, father, and sister; Diabetes in her mother; Heart attack in her father; Heart disease in her father and mother; Heart failure in her mother and sister; Hyperlipidemia in her father and mother; Hypertension in her father and mother; Other in her brother and sister; Pancreatic cancer in her brother; Stroke in her mother; Sudden death in her father.  ROS:   Please see the history of present illness.    ROS  All other systems reviewed and negative.   EKGs/Labs/Other Studies Reviewed:    The following studies were reviewed today: none  EKG:  EKG is not ordered today   Recent Labs: No results found for requested labs within last 365 days.   Recent Lipid Panel    Component Value Date/Time   CHOL 171 07/19/2019 1603   TRIG 127 07/19/2019 1603   HDL 47 07/19/2019 1603   CHOLHDL 3.6 07/19/2019 1603   CHOLHDL 4.5 03/18/2016 1000   VLDL 25 03/18/2016 1000   LDLCALC 101 (H) 07/19/2019 1603    Physical Exam:    VS:  BP 136/80   Pulse 66   Ht 5\' 3"  (1.6 m)   Wt 205 lb 12.8 oz (93.4 kg)   SpO2 96%   BMI 36.46 kg/m     Wt Readings from Last 3 Encounters:  05/16/22 205 lb 12.8 oz (93.4 kg)  08/06/21 200 lb 6.4 oz (90.9 kg)  06/14/20 208 lb 6.4 oz (94.5 kg)    GEN: Well nourished, well developed in no acute distress HEENT: Normal NECK: No JVD; No carotid bruits LYMPHATICS: No lymphadenopathy CARDIAC:RRR, no murmurs, rubs, gallops RESPIRATORY:  Clear to auscultation without rales, wheezing or rhonchi  ABDOMEN: Soft, non-tender, non-distended MUSCULOSKELETAL:  No edema; No deformity  SKIN: Warm and dry NEUROLOGIC:  Alert and oriented x 3 PSYCHIATRIC:  Normal affect  ASSESSMENT:    1. Chronic diastolic CHF (congestive heart failure) (HCC)   2. Essential hypertension, benign   3. Bradycardia   4. Pure hypercholesterolemia    PLAN:    In order of problems listed above:  1.  Chronic diastolic CHF -She appears euvolemic  on exam today -Her weight is stable and she denies any shortness of breath  -Continue prescription drug management with Lasix 20 mg daily with as needed refills -I have personally reviewed and interpreted outside labs performed by patient's PCP which showed SCR 0.64 and K+ 4.1 -check BMET today  2.  HTN -BP is controlled on exam today -Continue triple drug management lisinopril  10 mg daily with as needed refills  3.  Bradycardia -bradycardia resolved  4.  HLD -LDL goal is < 100 -I have personally reviewed and interpreted outside labs performed by patient's PCP which showed LDL 90, HDL 35, ALT 26 on 74/25/9563 and 04/24/2021  -continue prescription drug management with lovastatin 20 mg daily with as needed refills -repeat FLp and ALT  5.  LE edema -likely multifactorial from obesity, chronic venous insuff, prior TKR -it is gone by am if she elevates at night>>no edema on exam today just fat pads over the ankles -continue Lasix 20mg  daily with an extra 20mg  PRN for edema -I encouraged her to wear compression hose during the day   Medication Adjustments/Labs and Tests Ordered: Current medicines are reviewed at length with the patient today.  Concerns regarding medicines are outlined above.  No orders of the defined types were placed in this encounter.   No orders of the defined types were placed in this encounter.   Signed, , MD  05/16/2022 3:18 PM    [facility]

## 2022-05-17 ENCOUNTER — Telehealth: Payer: Self-pay

## 2022-05-17 DIAGNOSIS — E7800 Pure hypercholesterolemia, unspecified: Secondary | ICD-10-CM

## 2022-05-17 DIAGNOSIS — S42402D Unspecified fracture of lower end of left humerus, subsequent encounter for fracture with routine healing: Secondary | ICD-10-CM | POA: Diagnosis not present

## 2022-05-17 DIAGNOSIS — I5032 Chronic diastolic (congestive) heart failure: Secondary | ICD-10-CM

## 2022-05-17 LAB — COMPREHENSIVE METABOLIC PANEL
ALT: 50 IU/L — ABNORMAL HIGH (ref 0–32)
AST: 45 IU/L — ABNORMAL HIGH (ref 0–40)
Albumin/Globulin Ratio: 2.2 (ref 1.2–2.2)
Albumin: 5.1 g/dL — ABNORMAL HIGH (ref 3.8–4.8)
Alkaline Phosphatase: 97 IU/L (ref 44–121)
BUN/Creatinine Ratio: 27 (ref 12–28)
BUN: 20 mg/dL (ref 8–27)
Bilirubin Total: 0.6 mg/dL (ref 0.0–1.2)
CO2: 21 mmol/L (ref 20–29)
Calcium: 10 mg/dL (ref 8.7–10.3)
Chloride: 102 mmol/L (ref 96–106)
Creatinine, Ser: 0.73 mg/dL (ref 0.57–1.00)
Globulin, Total: 2.3 g/dL (ref 1.5–4.5)
Glucose: 96 mg/dL (ref 70–99)
Potassium: 4 mmol/L (ref 3.5–5.2)
Sodium: 141 mmol/L (ref 134–144)
Total Protein: 7.4 g/dL (ref 6.0–8.5)
eGFR: 84 mL/min/{1.73_m2} (ref >59)

## 2022-05-17 LAB — LIPID PANEL
Chol/HDL Ratio: 4.7 ratio — ABNORMAL HIGH (ref 0.0–4.4)
Cholesterol, Total: 163 mg/dL (ref 100–199)
HDL: 35 mg/dL — ABNORMAL LOW (ref >39)
LDL Chol Calc (NIH): 93 mg/dL (ref 0–99)
Triglycerides: 202 mg/dL — ABNORMAL HIGH (ref 0–149)
VLDL Cholesterol Cal: 35 mg/dL (ref 5–40)

## 2022-05-17 NOTE — Telephone Encounter (Signed)
-----   Message from Quintella Reichert, MD sent at 05/17/2022  7:45 AM EDT ----- Please find out if patient was fasting for these labs.  Her LFTs are mildly elevated and I would like these repeated in 4 weeks.  Have her avoid Tylenol and alcohol

## 2022-05-17 NOTE — Telephone Encounter (Signed)
The patient has been notified of the result and verbalized understanding.  All questions (if any) were answered. Theresia Majors, RN 05/17/2022 4:03 PM  Labs have been scheduled.

## 2022-05-20 DIAGNOSIS — M25522 Pain in left elbow: Secondary | ICD-10-CM | POA: Diagnosis not present

## 2022-05-20 DIAGNOSIS — Z4789 Encounter for other orthopedic aftercare: Secondary | ICD-10-CM | POA: Diagnosis not present

## 2022-05-20 DIAGNOSIS — R531 Weakness: Secondary | ICD-10-CM | POA: Diagnosis not present

## 2022-05-27 DIAGNOSIS — M25522 Pain in left elbow: Secondary | ICD-10-CM | POA: Diagnosis not present

## 2022-05-27 DIAGNOSIS — R531 Weakness: Secondary | ICD-10-CM | POA: Diagnosis not present

## 2022-05-27 DIAGNOSIS — Z4789 Encounter for other orthopedic aftercare: Secondary | ICD-10-CM | POA: Diagnosis not present

## 2022-06-28 ENCOUNTER — Ambulatory Visit: Payer: Medicare Other | Attending: Cardiology

## 2022-06-28 DIAGNOSIS — I5032 Chronic diastolic (congestive) heart failure: Secondary | ICD-10-CM | POA: Diagnosis not present

## 2022-06-28 DIAGNOSIS — E7800 Pure hypercholesterolemia, unspecified: Secondary | ICD-10-CM

## 2022-06-28 DIAGNOSIS — S42402D Unspecified fracture of lower end of left humerus, subsequent encounter for fracture with routine healing: Secondary | ICD-10-CM | POA: Diagnosis not present

## 2022-06-28 LAB — HEPATIC FUNCTION PANEL
ALT: 55 IU/L — ABNORMAL HIGH (ref 0–32)
AST: 38 IU/L (ref 0–40)
Albumin: 4.8 g/dL (ref 3.8–4.8)
Alkaline Phosphatase: 94 IU/L (ref 44–121)
Bilirubin Total: 0.5 mg/dL (ref 0.0–1.2)
Bilirubin, Direct: 0.17 mg/dL (ref 0.00–0.40)
Total Protein: 6.8 g/dL (ref 6.0–8.5)

## 2022-07-01 ENCOUNTER — Telehealth: Payer: Self-pay

## 2022-07-01 DIAGNOSIS — R748 Abnormal levels of other serum enzymes: Secondary | ICD-10-CM

## 2022-07-01 NOTE — Telephone Encounter (Signed)
Pt advised her lab results and agreed to seeing a GI MD.

## 2022-07-01 NOTE — Telephone Encounter (Signed)
-----   Message from Sueanne Margarita, MD sent at 06/28/2022 10:20 PM EDT ----- ALT still mildly elevated - please refer to GI for evaluation

## 2022-07-04 DIAGNOSIS — Z23 Encounter for immunization: Secondary | ICD-10-CM | POA: Diagnosis not present

## 2022-08-23 DIAGNOSIS — E782 Mixed hyperlipidemia: Secondary | ICD-10-CM | POA: Diagnosis not present

## 2022-08-23 DIAGNOSIS — Z7185 Encounter for immunization safety counseling: Secondary | ICD-10-CM | POA: Diagnosis not present

## 2022-08-23 DIAGNOSIS — I10 Essential (primary) hypertension: Secondary | ICD-10-CM | POA: Diagnosis not present

## 2022-08-23 DIAGNOSIS — Z1231 Encounter for screening mammogram for malignant neoplasm of breast: Secondary | ICD-10-CM | POA: Diagnosis not present

## 2022-08-23 DIAGNOSIS — Z0000 Encounter for general adult medical examination without abnormal findings: Secondary | ICD-10-CM | POA: Diagnosis not present

## 2022-08-23 DIAGNOSIS — Z1211 Encounter for screening for malignant neoplasm of colon: Secondary | ICD-10-CM | POA: Diagnosis not present

## 2022-09-19 ENCOUNTER — Ambulatory Visit: Payer: Medicare Other | Admitting: Cardiology

## 2022-09-25 DIAGNOSIS — L400 Psoriasis vulgaris: Secondary | ICD-10-CM | POA: Diagnosis not present

## 2022-09-25 DIAGNOSIS — L820 Inflamed seborrheic keratosis: Secondary | ICD-10-CM | POA: Diagnosis not present

## 2022-09-25 DIAGNOSIS — L814 Other melanin hyperpigmentation: Secondary | ICD-10-CM | POA: Diagnosis not present

## 2022-11-11 DIAGNOSIS — K219 Gastro-esophageal reflux disease without esophagitis: Secondary | ICD-10-CM | POA: Diagnosis not present

## 2022-12-13 DIAGNOSIS — J189 Pneumonia, unspecified organism: Secondary | ICD-10-CM | POA: Diagnosis not present

## 2022-12-13 DIAGNOSIS — R0602 Shortness of breath: Secondary | ICD-10-CM | POA: Diagnosis not present

## 2022-12-13 DIAGNOSIS — R051 Acute cough: Secondary | ICD-10-CM | POA: Diagnosis not present

## 2022-12-26 DIAGNOSIS — K5730 Diverticulosis of large intestine without perforation or abscess without bleeding: Secondary | ICD-10-CM | POA: Diagnosis not present

## 2022-12-26 DIAGNOSIS — Z1211 Encounter for screening for malignant neoplasm of colon: Secondary | ICD-10-CM | POA: Diagnosis not present

## 2022-12-26 DIAGNOSIS — K648 Other hemorrhoids: Secondary | ICD-10-CM | POA: Diagnosis not present

## 2023-01-10 DIAGNOSIS — L239 Allergic contact dermatitis, unspecified cause: Secondary | ICD-10-CM | POA: Diagnosis not present

## 2023-01-10 DIAGNOSIS — I5032 Chronic diastolic (congestive) heart failure: Secondary | ICD-10-CM | POA: Diagnosis not present

## 2023-01-10 DIAGNOSIS — R609 Edema, unspecified: Secondary | ICD-10-CM | POA: Diagnosis not present

## 2023-01-10 DIAGNOSIS — L539 Erythematous condition, unspecified: Secondary | ICD-10-CM | POA: Diagnosis not present

## 2023-01-10 DIAGNOSIS — W57XXXA Bitten or stung by nonvenomous insect and other nonvenomous arthropods, initial encounter: Secondary | ICD-10-CM | POA: Diagnosis not present

## 2023-02-10 ENCOUNTER — Other Ambulatory Visit (HOSPITAL_BASED_OUTPATIENT_CLINIC_OR_DEPARTMENT_OTHER): Payer: Self-pay | Admitting: Family Medicine

## 2023-02-10 DIAGNOSIS — Z1231 Encounter for screening mammogram for malignant neoplasm of breast: Secondary | ICD-10-CM

## 2023-02-18 DIAGNOSIS — I110 Hypertensive heart disease with heart failure: Secondary | ICD-10-CM | POA: Diagnosis not present

## 2023-02-18 DIAGNOSIS — I5032 Chronic diastolic (congestive) heart failure: Secondary | ICD-10-CM | POA: Diagnosis not present

## 2023-03-03 ENCOUNTER — Encounter (HOSPITAL_BASED_OUTPATIENT_CLINIC_OR_DEPARTMENT_OTHER): Payer: Self-pay

## 2023-03-03 ENCOUNTER — Ambulatory Visit (HOSPITAL_BASED_OUTPATIENT_CLINIC_OR_DEPARTMENT_OTHER)
Admission: RE | Admit: 2023-03-03 | Discharge: 2023-03-03 | Disposition: A | Discharge status: Home / Self Care | Payer: Medicare Other | Source: Ambulatory Visit | Attending: Family Medicine | Admitting: Family Medicine

## 2023-03-03 DIAGNOSIS — Z1231 Encounter for screening mammogram for malignant neoplasm of breast: Secondary | ICD-10-CM | POA: Insufficient documentation

## 2023-03-06 DIAGNOSIS — G4700 Insomnia, unspecified: Secondary | ICD-10-CM | POA: Diagnosis not present

## 2023-03-06 DIAGNOSIS — Z9849 Cataract extraction status, unspecified eye: Secondary | ICD-10-CM | POA: Diagnosis not present

## 2023-03-25 DIAGNOSIS — K080 Exfoliation of teeth due to systemic causes: Secondary | ICD-10-CM | POA: Diagnosis not present

## 2023-04-03 DIAGNOSIS — K080 Exfoliation of teeth due to systemic causes: Secondary | ICD-10-CM | POA: Diagnosis not present

## 2023-04-07 DIAGNOSIS — K080 Exfoliation of teeth due to systemic causes: Secondary | ICD-10-CM | POA: Diagnosis not present

## 2023-04-10 DIAGNOSIS — K080 Exfoliation of teeth due to systemic causes: Secondary | ICD-10-CM | POA: Diagnosis not present

## 2023-04-16 DIAGNOSIS — K080 Exfoliation of teeth due to systemic causes: Secondary | ICD-10-CM | POA: Diagnosis not present

## 2023-04-17 DIAGNOSIS — L570 Actinic keratosis: Secondary | ICD-10-CM | POA: Diagnosis not present

## 2023-04-17 DIAGNOSIS — L821 Other seborrheic keratosis: Secondary | ICD-10-CM | POA: Diagnosis not present

## 2023-04-17 DIAGNOSIS — L400 Psoriasis vulgaris: Secondary | ICD-10-CM | POA: Diagnosis not present

## 2023-05-19 DIAGNOSIS — H35361 Drusen (degenerative) of macula, right eye: Secondary | ICD-10-CM | POA: Diagnosis not present

## 2023-05-19 DIAGNOSIS — H35372 Puckering of macula, left eye: Secondary | ICD-10-CM | POA: Diagnosis not present

## 2023-05-19 DIAGNOSIS — H04123 Dry eye syndrome of bilateral lacrimal glands: Secondary | ICD-10-CM | POA: Diagnosis not present

## 2023-05-19 DIAGNOSIS — H524 Presbyopia: Secondary | ICD-10-CM | POA: Diagnosis not present

## 2023-05-19 DIAGNOSIS — H40013 Open angle with borderline findings, low risk, bilateral: Secondary | ICD-10-CM | POA: Diagnosis not present

## 2023-06-09 DIAGNOSIS — Z23 Encounter for immunization: Secondary | ICD-10-CM | POA: Diagnosis not present

## 2023-06-12 DIAGNOSIS — H18832 Recurrent erosion of cornea, left eye: Secondary | ICD-10-CM | POA: Diagnosis not present

## 2023-06-16 DIAGNOSIS — H18832 Recurrent erosion of cornea, left eye: Secondary | ICD-10-CM | POA: Diagnosis not present

## 2023-07-02 DIAGNOSIS — L820 Inflamed seborrheic keratosis: Secondary | ICD-10-CM | POA: Diagnosis not present

## 2023-07-02 DIAGNOSIS — B078 Other viral warts: Secondary | ICD-10-CM | POA: Diagnosis not present

## 2023-08-25 DIAGNOSIS — I110 Hypertensive heart disease with heart failure: Secondary | ICD-10-CM | POA: Diagnosis not present

## 2023-08-25 DIAGNOSIS — G4700 Insomnia, unspecified: Secondary | ICD-10-CM | POA: Diagnosis not present

## 2023-08-25 DIAGNOSIS — Z0000 Encounter for general adult medical examination without abnormal findings: Secondary | ICD-10-CM | POA: Diagnosis not present

## 2023-08-25 DIAGNOSIS — I10 Essential (primary) hypertension: Secondary | ICD-10-CM | POA: Diagnosis not present

## 2023-08-25 DIAGNOSIS — I5032 Chronic diastolic (congestive) heart failure: Secondary | ICD-10-CM | POA: Diagnosis not present

## 2023-08-25 DIAGNOSIS — E782 Mixed hyperlipidemia: Secondary | ICD-10-CM | POA: Diagnosis not present

## 2023-10-01 DIAGNOSIS — D0472 Carcinoma in situ of skin of left lower limb, including hip: Secondary | ICD-10-CM | POA: Diagnosis not present

## 2023-10-01 DIAGNOSIS — L570 Actinic keratosis: Secondary | ICD-10-CM | POA: Diagnosis not present

## 2023-10-13 DIAGNOSIS — K080 Exfoliation of teeth due to systemic causes: Secondary | ICD-10-CM | POA: Diagnosis not present

## 2023-11-04 DIAGNOSIS — M25552 Pain in left hip: Secondary | ICD-10-CM | POA: Diagnosis not present

## 2023-11-10 DIAGNOSIS — M25552 Pain in left hip: Secondary | ICD-10-CM | POA: Diagnosis not present

## 2023-12-10 ENCOUNTER — Encounter: Payer: Self-pay | Admitting: Cardiology

## 2023-12-10 ENCOUNTER — Ambulatory Visit: Payer: Medicare Other | Attending: Cardiology | Admitting: Cardiology

## 2023-12-10 VITALS — BP 134/72 | HR 59 | Resp 16 | Ht 63.0 in | Wt 197.4 lb

## 2023-12-10 DIAGNOSIS — R001 Bradycardia, unspecified: Secondary | ICD-10-CM | POA: Diagnosis not present

## 2023-12-10 DIAGNOSIS — E7800 Pure hypercholesterolemia, unspecified: Secondary | ICD-10-CM | POA: Diagnosis not present

## 2023-12-10 DIAGNOSIS — I5032 Chronic diastolic (congestive) heart failure: Secondary | ICD-10-CM | POA: Diagnosis not present

## 2023-12-10 DIAGNOSIS — I10 Essential (primary) hypertension: Secondary | ICD-10-CM

## 2023-12-10 DIAGNOSIS — R600 Localized edema: Secondary | ICD-10-CM

## 2023-12-10 NOTE — Patient Instructions (Signed)
 Medication Instructions:  Your physician recommends that you continue on your current medications as directed. Please refer to the Current Medication list given to you today.  *If you need a refill on your cardiac medications before your next appointment, please call your pharmacy*  Lab Work: None ordered today. If you have labs (blood work) drawn today and your tests are completely normal, you will receive your results only by: MyChart Message (if you have MyChart) OR A paper copy in the mail If you have any lab test that is abnormal or we need to change your treatment, we will call you to review the results.  Testing/Procedures: None ordered today.  Follow-Up: At [facility] you and your health needs are our priority.  As part of our continuing mission to provide you with exceptional heart care, we have created designated Provider Care Teams.  These Care Teams include your primary Cardiologist (physician) and Advanced Practice Providers (APPs -  Physician Assistants and Nurse Practitioners) who all work together to provide you with the care you need, when you need it.  We recommend signing up for the patient portal called "MyChart".  Sign up information is provided on this After Visit Summary.  MyChart is used to connect with patients for Virtual Visits (Telemedicine).  Patients are able to view lab/test results, encounter notes, upcoming appointments, etc.  Non-urgent messages can be sent to your provider as well.   To learn more about what you can do with MyChart, go to [email or web address].    Your next appointment:   1 year(s)  The format for your next appointment:   In Person  Provider:   Armanda Magic, MD {  Other Instructions   1st Floor: - Lobby - Registration  - Pharmacy  - Lab - Cafe  2nd Floor: - PV Lab - Diagnostic Testing (echo, CT, nuclear med)  3rd Floor: - Vacant  4th Floor: - TCTS (cardiothoracic surgery) - AFib Clinic - Structural Heart  Clinic - Vascular Surgery  - Vascular Ultrasound  5th Floor: - Heart[facility] Cardiology (general and EP) - Clinical Pharmacy for coumadin, hypertension, lipid, weight-loss medications, and med management appointments    Valet parking services will be available as well.

## 2023-12-10 NOTE — Progress Notes (Signed)
 Cardiology Office Note:    Date:  12/10/2023   ID:  [name], DOB November 16, 1940, MRN [id number]  PCP:  Koren Shiver, DO  Cardiologist:  Armanda Magic, MD    Referring MD: Koren Shiver, DO   Chief Complaint  Patient presents with   Congestive Heart Failure   Hypertension   Hyperlipidemia    History of Present Illness:    [name] is a 83 y.o. female with a hx of chronic diastolic CHF, normal LVF, normal coronary arteries by cath 2010, HTN and dyslipidemia.    She is here today for followup and is doing well.  She denies any chest pain or pressure, SOB, DOE, PND, orthopnea,  dizziness, (except when she had the flu) palpitations or syncope. She does have chronic LE edema when she stands for too long.  She has been taking care of her brother for a while. She is compliant with her meds and is tolerating meds with no SE.    Past Medical History:  Diagnosis Date   Bradycardia 02/28/2017   Chronic diastolic CHF (congestive heart failure) (HCC)    Constipation    Essential hypertension 05/06/2017   GERD (gastroesophageal reflux disease)    Hyperlipemia    dyslipidemia   Hypertension    Knee pain    bilateral   Obesity    Shoulder pain    right shoulder   Swelling    feet and legs   Vitamin D deficiency 05/06/2017    Past Surgical History:  Procedure Laterality Date   ABDOMINAL HYSTERECTOMY     APPENDECTOMY  1977   CARDIAC CATHETERIZATION  02/2009   no evidence of CAD/ Elevated LVEDP at the time ofcath c/w diastolic dysfunction   KNEE SURGERY  1986   NECK SURGERY     OOPHORECTOMY  1972   SHOULDER ARTHROSCOPY      Current Medications: Current Meds  Medication Sig   acetaminophen (TYLENOL) 650 MG CR tablet Take 1,300 mg by mouth every 8 (eight) hours as needed for pain.   aspirin EC 81 MG tablet Take 81 mg by mouth at bedtime.   furosemide (LASIX) 20 MG tablet TAKE 1 TABLET BY MOUTH DAILY   gabapentin (NEURONTIN) 300 MG capsule Take 300 mg  by mouth at bedtime.   lisinopril (ZESTRIL) 10 MG tablet Take 1 tablet (10 mg total) by mouth daily. Please make overdue appt with Dr. Mayford Knife before anymore refills. 2nd attempt   lovastatin (MEVACOR) 40 MG tablet Take 0.5 tablets (20 mg total) by mouth at bedtime. Please make overdue appt with Dr. Mayford Knife before anymore refills. 2nd attempt   Lysine 500 MG CAPS Take 1 capsule by mouth daily.   meloxicam (MOBIC) 7.5 MG tablet Take 7.5 mg by mouth daily.   Multiple Vitamin (MULTIVITAMIN WITH MINERALS) TABS tablet Take 1 tablet by mouth daily.   Omega-3 Fatty Acids (FISH OIL) 1000 MG CAPS Take 1,000 mg by mouth 2 (two) times daily.   omeprazole (PRILOSEC) 20 MG capsule Take 20 mg by mouth daily.   zolpidem (AMBIEN) 5 MG tablet Take 5 mg by mouth at bedtime.     Allergies:   Morphine and codeine, Morphine, and Morphine sulfate   Social History   Socioeconomic History   Marital status: Married    Spouse name: [name]   Number of children: Not on file   Years of education: Not on file   Highest education level: Not on file  Occupational History   Occupation:  retired - housekeeper  Tobacco Use   Smoking status: Never   Smokeless tobacco: Never  Vaping Use   Vaping status: Never Used  Substance and Sexual Activity   Alcohol use: No   Drug use: No   Sexual activity: Not on file  Other Topics Concern   Not on file  Social History Narrative   Not on file   Social Drivers of Health   Financial Resource Strain: Not on file  Food Insecurity: Not on file  Transportation Needs: Not on file  Physical Activity: Not on file  Stress: Not on file  Social Connections: Not on file     Family History: The patient's family history includes CAD in her brother, brother, brother, father, and sister; Diabetes in her mother; Heart attack in her father; Heart disease in her father and mother; Heart failure in her mother and sister; Hyperlipidemia in her father and mother; Hypertension in her father  and mother; Other in her brother and sister; Pancreatic cancer in her brother; Stroke in her mother; Sudden death in her father.  ROS:   Please see the history of present illness.    ROS  All other systems reviewed and negative.   EKGs/Labs/Other Studies Reviewed:    The following studies were reviewed today:  EKG Interpretation Date/Time:  Wednesday December 10 2023 09:56:04 EDT Ventricular Rate:  59 PR Interval:  166 QRS Duration:  80 QT Interval:  430 QTC Calculation: 425 R Axis:   -35  Text Interpretation: Sinus bradycardia Left axis deviation Septal infarct (cited on or before 22-Dec-2019) When compared with ECG of 22-Dec-2019 12:18, No significant change was found Confirmed by Armanda Magic ([id number]) on 12/10/2023 10:02:23 AM    Recent Labs: No results found for requested labs within last 365 days.   Recent Lipid Panel    Component Value Date/Time   CHOL 163 05/16/2022 1532   TRIG 202 (H) 05/16/2022 1532   HDL 35 (L) 05/16/2022 1532   CHOLHDL 4.7 (H) 05/16/2022 1532   CHOLHDL 4.5 03/18/2016 1000   VLDL 25 03/18/2016 1000   LDLCALC 93 05/16/2022 1532    Physical Exam:    VS:  BP 134/72 (BP Location: Left Arm, Patient Position: Sitting, Cuff Size: Large)   Pulse (!) 59   Resp 16   Ht 5\' 3"  (1.6 m)   Wt 197 lb 6.4 oz (89.5 kg)   SpO2 97%   BMI 34.97 kg/m     Wt Readings from Last 3 Encounters:  12/10/23 197 lb 6.4 oz (89.5 kg)  05/16/22 205 lb 12.8 oz (93.4 kg)  08/06/21 200 lb 6.4 oz (90.9 kg)    GEN: Well nourished, well developed in no acute distress HEENT: Normal NECK: No JVD; No carotid bruits LYMPHATICS: No lymphadenopathy CARDIAC:RRR, no murmurs, rubs, gallops RESPIRATORY:  Clear to auscultation without rales, wheezing or rhonchi  ABDOMEN: Soft, non-tender, non-distended MUSCULOSKELETAL:  No edema; No deformity  SKIN: Warm and dry NEUROLOGIC:  Alert and oriented x 3 PSYCHIATRIC:  Normal affect  ASSESSMENT:    1. Chronic diastolic CHF (congestive  heart failure) (HCC)   2. Essential hypertension, benign   3. Bradycardia   4. Pure hypercholesterolemia   5. Lower extremity edema    PLAN:    In order of problems listed above:  1.  Chronic diastolic CHF -She does not appear volume overloaded on exam today -She denies any shortness of breath and weight remains stable -Continue drug management with Lasix 20 mg daily with as  needed refills -I have personally reviewed and interpreted outside labs performed by patient's PCP which showed potassium 4.4 on 08/25/2023 -I will get a copy of her last serum creatinine from her PCP  2.  HTN -BP controlled on exam today -Continue prescription drug management with lisinopril 10 mg daily with as needed refills  3.  Bradycardia -stable HR 59bpm today  4.  HLD -LDL goal is < 100 -I have personally reviewed and interpreted outside labs performed by patient's PCP which showed LDL 87 HDL 36  on 08/24/2023 -I will get a copy of her last ALT for her PCP -Continue drug management with lovastatin 20 mg daily with as needed refills  5.  LE edema -likely multifactorial from obesity, chronic venous insuff, prior TKR -it is gone by am if she elevates at night -Continue daily Lasix 20 mg with an extra 20 mg as needed for lower extremity edema  Followup with me in 1 year   Medication Adjustments/Labs and Tests Ordered: Current medicines are reviewed at length with the patient today.  Concerns regarding medicines are outlined above.  Orders Placed This Encounter  Procedures   EKG 12-Lead    No orders of the defined types were placed in this encounter.   Signed, Armanda Magic, MD  12/10/2023 10:12 AM    [facility]

## 2024-02-11 ENCOUNTER — Other Ambulatory Visit (HOSPITAL_BASED_OUTPATIENT_CLINIC_OR_DEPARTMENT_OTHER): Payer: Self-pay | Admitting: Family Medicine

## 2024-02-11 DIAGNOSIS — Z1231 Encounter for screening mammogram for malignant neoplasm of breast: Secondary | ICD-10-CM

## 2024-03-04 DIAGNOSIS — Z6837 Body mass index (BMI) 37.0-37.9, adult: Secondary | ICD-10-CM | POA: Diagnosis not present

## 2024-03-04 DIAGNOSIS — I10 Essential (primary) hypertension: Secondary | ICD-10-CM | POA: Diagnosis not present

## 2024-03-04 DIAGNOSIS — G4700 Insomnia, unspecified: Secondary | ICD-10-CM | POA: Diagnosis not present

## 2024-03-04 DIAGNOSIS — E782 Mixed hyperlipidemia: Secondary | ICD-10-CM | POA: Diagnosis not present

## 2024-03-22 ENCOUNTER — Encounter (HOSPITAL_BASED_OUTPATIENT_CLINIC_OR_DEPARTMENT_OTHER): Payer: Self-pay

## 2024-03-22 ENCOUNTER — Ambulatory Visit (HOSPITAL_BASED_OUTPATIENT_CLINIC_OR_DEPARTMENT_OTHER)
Admission: RE | Admit: 2024-03-22 | Discharge: 2024-03-22 | Disposition: A | Discharge status: Home / Self Care | Source: Ambulatory Visit | Attending: Family Medicine | Admitting: Family Medicine

## 2024-03-22 DIAGNOSIS — Z1231 Encounter for screening mammogram for malignant neoplasm of breast: Secondary | ICD-10-CM | POA: Insufficient documentation

## 2024-04-07 ENCOUNTER — Other Ambulatory Visit (HOSPITAL_COMMUNITY): Payer: Self-pay | Admitting: Orthopedic Surgery

## 2024-04-07 ENCOUNTER — Ambulatory Visit (HOSPITAL_COMMUNITY)
Admission: RE | Admit: 2024-04-07 | Discharge: 2024-04-07 | Disposition: A | Discharge status: Home / Self Care | Source: Ambulatory Visit | Attending: Vascular Surgery | Admitting: Vascular Surgery

## 2024-04-07 DIAGNOSIS — R609 Edema, unspecified: Secondary | ICD-10-CM | POA: Diagnosis not present

## 2024-04-07 DIAGNOSIS — M25561 Pain in right knee: Secondary | ICD-10-CM | POA: Diagnosis not present

## 2024-04-12 DIAGNOSIS — K080 Exfoliation of teeth due to systemic causes: Secondary | ICD-10-CM | POA: Diagnosis not present

## 2024-05-27 DIAGNOSIS — H35361 Drusen (degenerative) of macula, right eye: Secondary | ICD-10-CM | POA: Diagnosis not present

## 2024-05-27 DIAGNOSIS — H35372 Puckering of macula, left eye: Secondary | ICD-10-CM | POA: Diagnosis not present

## 2024-05-27 DIAGNOSIS — H04123 Dry eye syndrome of bilateral lacrimal glands: Secondary | ICD-10-CM | POA: Diagnosis not present

## 2024-05-27 DIAGNOSIS — H40013 Open angle with borderline findings, low risk, bilateral: Secondary | ICD-10-CM | POA: Diagnosis not present

## 2024-06-28 DIAGNOSIS — Z23 Encounter for immunization: Secondary | ICD-10-CM | POA: Diagnosis not present
